# Patient Record
Sex: Male | Born: 1965 | Race: White | Hispanic: No | Marital: Married | Smoking: Never smoker
Health system: Southern US, Community
[De-identification: ages and names within clinical notes are randomized; demographics above are authoritative.]

## PROBLEM LIST (undated history)

## (undated) DIAGNOSIS — E119 Type 2 diabetes mellitus without complications: Secondary | ICD-10-CM

## (undated) DIAGNOSIS — M1990 Unspecified osteoarthritis, unspecified site: Secondary | ICD-10-CM

## (undated) DIAGNOSIS — F32A Depression, unspecified: Secondary | ICD-10-CM

## (undated) DIAGNOSIS — F419 Anxiety disorder, unspecified: Secondary | ICD-10-CM

## (undated) DIAGNOSIS — G4730 Sleep apnea, unspecified: Secondary | ICD-10-CM

## (undated) DIAGNOSIS — J189 Pneumonia, unspecified organism: Secondary | ICD-10-CM

## (undated) DIAGNOSIS — F4310 Post-traumatic stress disorder, unspecified: Secondary | ICD-10-CM

## (undated) DIAGNOSIS — C801 Malignant (primary) neoplasm, unspecified: Secondary | ICD-10-CM

## (undated) DIAGNOSIS — H9319 Tinnitus, unspecified ear: Secondary | ICD-10-CM

## (undated) DIAGNOSIS — Z974 Presence of external hearing-aid: Secondary | ICD-10-CM

## (undated) DIAGNOSIS — J45909 Unspecified asthma, uncomplicated: Secondary | ICD-10-CM

## (undated) DIAGNOSIS — T884XXA Failed or difficult intubation, initial encounter: Secondary | ICD-10-CM

## (undated) DIAGNOSIS — Z87442 Personal history of urinary calculi: Secondary | ICD-10-CM

## (undated) DIAGNOSIS — I10 Essential (primary) hypertension: Secondary | ICD-10-CM

## (undated) HISTORY — PX: TOTAL HIP ARTHROPLASTY: SHX124

## (undated) HISTORY — PX: TENDON REPAIR: SHX5111

## (undated) HISTORY — PX: KNEE ARTHROSCOPY: SUR90

## (undated) HISTORY — PX: JOINT REPLACEMENT: SHX530

## (undated) HISTORY — PX: BACK SURGERY: SHX140

## (undated) HISTORY — PX: ROTATOR CUFF REPAIR: SHX139

## (undated) HISTORY — PX: HERNIA REPAIR: SHX51

## (undated) HISTORY — PX: CARPAL TUNNEL RELEASE: SHX101

---
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
## 2013-08-21 ENCOUNTER — Ambulatory Visit: Payer: Self-pay | Admitting: Family Medicine

## 2013-12-21 ENCOUNTER — Ambulatory Visit: Payer: Self-pay | Admitting: Physician Assistant

## 2017-11-09 ENCOUNTER — Ambulatory Visit: Payer: PRIVATE HEALTH INSURANCE | Admitting: Allergy and Immunology

## 2021-02-10 HISTORY — PX: REPAIR / REINSERT BICEPS TENDON AT ELBOW: SUR1148

## 2021-07-13 ENCOUNTER — Other Ambulatory Visit: Payer: Self-pay

## 2021-07-13 ENCOUNTER — Encounter: Payer: Self-pay | Admitting: Emergency Medicine

## 2021-07-13 ENCOUNTER — Emergency Department: Payer: BC Managed Care – PPO

## 2021-07-13 ENCOUNTER — Emergency Department
Admission: EM | Admit: 2021-07-13 | Discharge: 2021-07-13 | Disposition: A | Discharge status: Home / Self Care | Payer: BC Managed Care – PPO | Attending: Emergency Medicine | Admitting: Emergency Medicine

## 2021-07-13 DIAGNOSIS — E860 Dehydration: Secondary | ICD-10-CM | POA: Insufficient documentation

## 2021-07-13 DIAGNOSIS — M25562 Pain in left knee: Secondary | ICD-10-CM | POA: Diagnosis not present

## 2021-07-13 DIAGNOSIS — R55 Syncope and collapse: Secondary | ICD-10-CM | POA: Diagnosis present

## 2021-07-13 DIAGNOSIS — I951 Orthostatic hypotension: Secondary | ICD-10-CM | POA: Insufficient documentation

## 2021-07-13 DIAGNOSIS — M79672 Pain in left foot: Secondary | ICD-10-CM | POA: Insufficient documentation

## 2021-07-13 DIAGNOSIS — R197 Diarrhea, unspecified: Secondary | ICD-10-CM | POA: Diagnosis not present

## 2021-07-13 DIAGNOSIS — I10 Essential (primary) hypertension: Secondary | ICD-10-CM | POA: Insufficient documentation

## 2021-07-13 DIAGNOSIS — R10814 Left lower quadrant abdominal tenderness: Secondary | ICD-10-CM | POA: Diagnosis not present

## 2021-07-13 HISTORY — DX: Essential (primary) hypertension: I10

## 2021-07-13 LAB — CBC WITH DIFFERENTIAL/PLATELET
Abs Immature Granulocytes: 0.05 10*3/uL (ref 0.00–0.07)
Basophils Absolute: 0 10*3/uL (ref 0.0–0.1)
Basophils Relative: 0 %
Eosinophils Absolute: 0.1 10*3/uL (ref 0.0–0.5)
Eosinophils Relative: 1 %
HCT: 39.6 % (ref 39.0–52.0)
Hemoglobin: 13.1 g/dL (ref 13.0–17.0)
Immature Granulocytes: 1 %
Lymphocytes Relative: 46 %
Lymphs Abs: 2.8 10*3/uL (ref 0.7–4.0)
MCH: 29.8 pg (ref 26.0–34.0)
MCHC: 33.1 g/dL (ref 30.0–36.0)
MCV: 90.2 fL (ref 80.0–100.0)
Monocytes Absolute: 0.5 10*3/uL (ref 0.1–1.0)
Monocytes Relative: 9 %
Neutro Abs: 2.6 10*3/uL (ref 1.7–7.7)
Neutrophils Relative %: 43 %
Platelets: 175 10*3/uL (ref 150–400)
RBC: 4.39 MIL/uL (ref 4.22–5.81)
RDW: 11.6 % (ref 11.5–15.5)
WBC: 6.1 10*3/uL (ref 4.0–10.5)
nRBC: 0 % (ref 0.0–0.2)

## 2021-07-13 LAB — COMPREHENSIVE METABOLIC PANEL
ALT: 32 U/L (ref 0–44)
AST: 19 U/L (ref 15–41)
Albumin: 3.7 g/dL (ref 3.5–5.0)
Alkaline Phosphatase: 79 U/L (ref 38–126)
Anion gap: 7 (ref 5–15)
BUN: 12 mg/dL (ref 6–20)
CO2: 28 mmol/L (ref 22–32)
Calcium: 8.7 mg/dL — ABNORMAL LOW (ref 8.9–10.3)
Chloride: 104 mmol/L (ref 98–111)
Creatinine, Ser: 0.94 mg/dL (ref 0.61–1.24)
GFR, Estimated: >60 mL/min (ref >60)
Glucose, Bld: 145 mg/dL — ABNORMAL HIGH (ref 70–99)
Potassium: 3.6 mmol/L (ref 3.5–5.1)
Sodium: 139 mmol/L (ref 135–145)
Total Bilirubin: 0.7 mg/dL (ref 0.3–1.2)
Total Protein: 6.9 g/dL (ref 6.5–8.1)

## 2021-07-13 LAB — TROPONIN I (HIGH SENSITIVITY): Troponin I (High Sensitivity): <2 ng/L (ref <18)

## 2021-07-13 LAB — TSH: TSH: 2.645 u[IU]/mL (ref 0.350–4.500)

## 2021-07-13 MED ORDER — OXYCODONE-ACETAMINOPHEN 5-325 MG PO TABS
1.0000 | ORAL_TABLET | Freq: Once | ORAL | Status: AC
  Administered 2021-07-13: 1 via ORAL
  Filled 2021-07-13: qty 1

## 2021-07-13 MED ORDER — LACTATED RINGERS IV BOLUS
1000.0000 mL | Freq: Once | INTRAVENOUS | Status: AC
  Administered 2021-07-13: 1000 mL via INTRAVENOUS

## 2021-07-13 MED ORDER — ONDANSETRON 4 MG PO TBDP
4.0000 mg | ORAL_TABLET | Freq: Once | ORAL | Status: AC
  Administered 2021-07-13: 4 mg via ORAL
  Filled 2021-07-13: qty 1

## 2021-07-13 NOTE — Discharge Instructions (Signed)
Stop any blood pressure medications for now, until you see your doctor on Monday.  Drink at least 6-8 glasses of water daily.  Discuss your Er visit, low blood pressure, and borderline low HR with your PCP on Monday.  Be very cautious going from sitting to standing.

## 2021-07-13 NOTE — ED Notes (Signed)
X-ray at bedside

## 2021-07-13 NOTE — ED Notes (Signed)
Pt ambulatory around the room by Byrd Hesselbach, NT, no dizziness noted. Pt states he feels better but his foot still hurts. Pt given saltines and water for a PO challenge denies any nausea at this time

## 2021-07-13 NOTE — ED Triage Notes (Signed)
Patient to ED via [facility] from home for syncopal episode after using the restroom this AM. Patient states he hit head on shower. C/o left knee and foot pain. Aox4 at this time.

## 2021-07-13 NOTE — ED Provider Notes (Signed)
[facility] Provider Note    Event Date/Time   First MD Initiated Contact with Patient 07/13/21 [phone]     (approximate)   History   Loss of Consciousness   HPI  Cameron [name] is a 56 y.o. male   with past medical history of hypertension, here with syncopal episode.  The patient states that this morning, he was getting up to start his day.  He was using the bathroom, standing, peeing, when he reportedly lost consciousness.  He states he did not necessarily feel a warning.  He states he does have a history of low blood pressure as well as low heart rate, and has had episodes where he gets dizzy standing.  He also recently had a bout of diverticulitis on 22 May, feels better from this but did have significant diarrhea.  He states he been trying to eat and drink normally since then.  Currently, he states he feels overall well.  Denies any significant pain other than his neck, which he has chronic pain but is mildly worse since the fall.  He has chronic left-sided radiculopathy that is not necessarily worse.  He also has some mild pain in his left knee and left foot.  Reports that he otherwise has had no recent medication changes.  Denies recent fevers or chills.  No chest pain.  He did not feel any palpitations.  With EMS, the patient was set up twice, and had recurrent lightheadedness, dizziness, followed by a recurrent witnessed syncopal episode.  There is no seizure-like episode.  No loss of bowel or bladder function.  No tongue biting.   Physical Exam   Triage Vital Signs: ED Triage Vitals  Enc Vitals Group     BP 07/13/21 0738 106/73     Pulse Rate 07/13/21 0738 62     Resp 07/13/21 0738 18     Temp --      Temp src --      SpO2 07/13/21 0738 95 %     Weight 07/13/21 0738 195 lb 3.2 oz (88.5 kg)     Height --      Head Circumference --      Peak Flow --      Pain Score 07/13/21 0743 8     Pain Loc --      Pain Edu? --      Excl. in GC? --      Most recent vital signs: Vitals:   07/13/21 1130 07/13/21 1200  BP: 105/73 97/77  Pulse: (!) 51 (!) 56  Resp: 10 11  Temp:    SpO2: 97% 96%     General: Awake, no distress.  CV:  Good peripheral perfusion.  No murmurs or rubs. Resp:  Normal effort.  Lungs clear bilaterally. Abd:  No distention.  Minimal left lower quadrant tenderness.  No rebound or guarding. Other:  Moderately dry mucous membranes.  Appears slightly pale.   ED Results / Procedures / Treatments   Labs (all labs ordered are listed, but only abnormal results are displayed) Labs Reviewed  COMPREHENSIVE METABOLIC PANEL - Abnormal; Notable for the following components:      Result Value   Glucose, Bld 145 (*)    Calcium 8.7 (*)    All other components within normal limits  CBC WITH DIFFERENTIAL/PLATELET  TSH  TROPONIN I (HIGH SENSITIVITY)  TROPONIN I (HIGH SENSITIVITY)     EKG Normal sinus rhythm, ventricular rate 60.  PR 146, QRS 103, QTc 472.  No acute ST elevations or depressions.  No EKG evidence of acute ischemia or infarct.   RADIOLOGY DG knee left: Negative DG foot left: Negative CT head/C-spine: Negative, chronic arthritic changes and prior surgical fixation, no acute abnormality DG elbow right: Negative  I also independently reviewed and agree with radiologist interpretations.   PROCEDURES:  Critical Care performed: No  .1-3 Lead EKG Interpretation Performed by: Shaune Pollack, MD Authorized by: Shaune Pollack, MD     Interpretation: abnormal     ECG rate:  50-60   ECG rate assessment: bradycardic     Rhythm: sinus rhythm     Ectopy: none     Conduction: normal   Comments:     Indication: Syncope/weakness    MEDICATIONS ORDERED IN ED: Medications  lactated ringers bolus 1,000 mL (0 mLs Intravenous Stopped 07/13/21 1100)  oxyCODONE-acetaminophen (PERCOCET/ROXICET) 5-325 MG per tablet 1 tablet (1 tablet Oral Given 07/13/21 0836)  ondansetron (ZOFRAN-ODT) disintegrating  tablet 4 mg (4 mg Oral Given 07/13/21 0835)  lactated ringers bolus 1,000 mL (0 mLs Intravenous Stopped 07/13/21 1100)     IMPRESSION / MDM / ASSESSMENT AND PLAN / ED COURSE  I reviewed the triage vital signs and the nursing notes.                               The patient is on the cardiac monitor to evaluate for evidence of arrhythmia and/or significant heart rate changes.   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  Patient appears dehydrated clinically, is hypotensive with position changes on arrival.  Of note, he reportedly has been running fairly low blood pressures since a recent bout of diverticulitis, and is actually being seen by his PCP for this.  He was also told that his heart rate runs low naturally.  He denies any chest pain.  He has been eating and drinking normally for the last day or 2, and states that his diverticulosis related pain is significantly improved.  Lab work reviewed, with CBC shows no significant leukocytosis or anemia.  He has no significant abdominal tenderness.  Do not suspect ongoing diverticulitis or complication.  CMP shows normal renal function and is otherwise reassuring.  Troponin negative, EKG nonischemic.  TSH normal.  Plain films of any areas of tenderness obtained, showed no acute fracture or abnormality.  CT head and C-spine obtained, show his chronic arthritis of his neck but no acute maladies.  No evidence to suggest acute radiculopathy.  Suspect relative orthostasis as well as effect of antihypertensive.  We will have him hold this, hydrate at home, and follow-up with  his PCP, which he already has arranged for follow-up on Monday.   MEDICATIONS GIVEN IN ED: Medications  lactated ringers bolus 1,000 mL (0 mLs Intravenous Stopped 07/13/21 1100)  oxyCODONE-acetaminophen (PERCOCET/ROXICET) 5-325 MG per tablet 1 tablet (1 tablet Oral Given 07/13/21 0836)  ondansetron (ZOFRAN-ODT) disintegrating tablet 4 mg (4 mg Oral Given 07/13/21 0835)  lactated ringers bolus 1,000 mL (0 mLs Intravenous Stopped 07/13/21 1100)     Consults:     EMR reviewed  Reviewed office visits with pulmonology at [facility], most recently 06/2021  FINAL CLINICAL IMPRESSION(S) / ED DIAGNOSES   Final diagnoses:  Syncope and collapse  Orthostasis     Rx / DC Orders   ED Discharge Orders     None        Note:  This document was prepared using Dragon voice recognition software and may include unintentional dictation errors.   Shaune Pollack, MD 07/13/21 1226

## 2021-12-10 ENCOUNTER — Other Ambulatory Visit: Payer: Self-pay | Admitting: Neurosurgery

## 2021-12-13 ENCOUNTER — Other Ambulatory Visit: Payer: Self-pay | Admitting: Neurosurgery

## 2022-01-17 NOTE — Pre-Procedure Instructions (Signed)
Surgical Instructions    Your procedure is scheduled on Monday, January 27, 2022.  Report to [facility] Main Entrance "A" at 5:30 A.M., then check in with the Admitting office.  Call this number if you have problems the morning of surgery:  [phone]   If you have any questions prior to your surgery date call [phone]: Open Monday-Friday 8am-4pm If you experience any cold or flu symptoms such as cough, fever, chills, shortness of breath, etc. between now and your scheduled surgery, please notify us at the above number     Remember:  Do not eat or drink after midnight the night before your surgery      Take these medicines the morning of surgery with A SIP OF WATER:  gabapentin (NEURONTIN)  venlafaxine XR (EFFEXOR-XR)  fluticasone (FLONASE)  Fluticasone-Umeclidin-Vilant (TRELEGY ELLIPTA)   IF NEEDED: albuterol (VENTOLIN HFA) -please bring this inhaler with you day of surgery  budesonide-formoterol (SYMBICORT) inhaler acetaminophen (TYLENOL)  rizatriptan (MAXALT)  Olopatadine HCl (PATADAY)   As of today, STOP taking any Aspirin (unless otherwise instructed by your surgeon) Aleve, Naproxen, Ibuprofen, Motrin, Advil, Goody's, BC's, all herbal medications, fish oil, and all vitamins.  WHAT DO I DO ABOUT MY DIABETES MEDICATION?  Please hold Semaglutide (Ozempic) 7 days prior to your surgery-please do not take this on Wednesday 01/22/22  HOW TO MANAGE YOUR DIABETES BEFORE AND AFTER SURGERY  Why is it important to control my blood sugar before and after surgery? Improving blood sugar levels before and after surgery helps healing and can limit problems. A way of improving blood sugar control is eating a healthy diet by:  Eating less sugar and carbohydrates  Increasing activity/exercise  Talking with your doctor about reaching your blood sugar goals High blood sugars (greater than 180 mg/dL) can raise your risk of infections and slow your recovery, so you will need to  focus on controlling your diabetes during the weeks before surgery. Make sure that the doctor who takes care of your diabetes knows about your planned surgery including the date and location.  How do I manage my blood sugar before surgery? Check your blood sugar at least 4 times a day, starting 2 days before surgery, to make sure that the level is not too high or low.  Check your blood sugar the morning of your surgery when you wake up and every 2 hours until you get to the Short Stay unit.  If your blood sugar is less than 70 mg/dL, you will need to treat for low blood sugar: Do not take insulin. Treat a low blood sugar (less than 70 mg/dL) with  cup of clear juice (cranberry or apple), 4 glucose tablets, OR glucose gel. Recheck blood sugar in 15 minutes after treatment (to make sure it is greater than 70 mg/dL). If your blood sugar is not greater than 70 mg/dL on recheck, call [phone] for further instructions. Report your blood sugar to the short stay nurse when you get to Short Stay.  If you are admitted to the hospital after surgery: Your blood sugar will be checked by the staff and you will probably be given insulin after surgery (instead of oral diabetes medicines) to make sure you have good blood sugar levels. The goal for blood sugar control after surgery is 80-180 mg/dL.           Do not wear jewelry. Do not wear lotions, powders, cologne or deodorant. Do not shave 48 hours prior to surgery.  Men may shave face  and neck. Do not bring valuables to the hospital. Do not wear nail polish, gel polish, artificial nails, or any other type of covering on natural nails (fingers and toes)  [facility] is not responsible for any belongings or valuables.    Do NOT Smoke (Tobacco/Vaping)  24 hours prior to your procedure  If you use a CPAP at night, you may bring your mask for your overnight stay.   Contacts, glasses, hearing aids, dentures or partials may not be worn into surgery,  please bring cases for these belongings   For patients admitted to the hospital, discharge time will be determined by your treatment team.   Patients discharged the day of surgery will not be allowed to drive home, and someone needs to stay with them for 24 hours.   SURGICAL WAITING ROOM VISITATION Patients having surgery or a procedure may have no more than 2 support people in the waiting area - these visitors may rotate.   Children under the age of 45 must have an adult with them who is not the patient. If the patient needs to stay at the hospital during part of their recovery, the visitor guidelines for inpatient rooms apply. Pre-op nurse will coordinate an appropriate time for 1 support person to accompany patient in pre-op.  This support person may not rotate.   Please refer to [email or web address] for the visitor guidelines for Inpatients (after your surgery is over and you are in a regular room).    Special instructions:    Oral Hygiene is also important to reduce your risk of infection.  Remember - BRUSH YOUR TEETH THE MORNING OF SURGERY WITH YOUR REGULAR TOOTHPASTE   [place]- Preparing For Surgery  Before surgery, you can play an important role. Because skin is not sterile, your skin needs to be as free of germs as possible. You can reduce the number of germs on your skin by washing with CHG (chlorahexidine gluconate) Soap before surgery.  CHG is an antiseptic cleaner which kills germs and bonds with the skin to continue killing germs even after washing.     Please do not use if you have an allergy to CHG or antibacterial soaps. If your skin becomes reddened/irritated stop using the CHG.  Do not shave (including legs and underarms) for at least 48 hours prior to first CHG shower. It is OK to shave your face.  Please follow these instructions carefully.     Shower the NIGHT BEFORE SURGERY and the MORNING OF SURGERY with CHG  Soap.   If you chose to wash your hair, wash your hair first as usual with your normal shampoo. After you shampoo, rinse your hair and body thoroughly to remove the shampoo.  Then [employer] and genitals (private parts) with your normal soap and rinse thoroughly to remove soap.  After that Use CHG Soap as you would any other liquid soap. You can apply CHG directly to the skin and wash gently with a scrungie or a clean washcloth.   Apply the CHG Soap to your body ONLY FROM THE NECK DOWN.  Do not use on open wounds or open sores. Avoid contact with your eyes, ears, mouth and genitals (private parts). Wash Face and genitals (private parts)  with your normal soap.   Wash thoroughly, paying special attention to the area where your surgery will be performed.  Thoroughly rinse your body with warm water from the neck down.  DO NOT shower/wash with your normal soap after using and rinsing  off the CHG Soap.  Pat yourself dry with a CLEAN TOWEL.  Wear CLEAN PAJAMAS to bed the night before surgery  Place CLEAN SHEETS on your bed the night before your surgery  DO NOT SLEEP WITH PETS.   Day of Surgery:  Take a shower with CHG soap. Wear Clean/Comfortable clothing the morning of surgery Do not apply any deodorants/lotions.   Remember to brush your teeth WITH YOUR REGULAR TOOTHPASTE.    If you received a COVID test during your pre-op visit, it is requested that you wear a mask when out in public, stay away from anyone that may not be feeling well, and notify your surgeon if you develop symptoms. If you have been in contact with anyone that has tested positive in the last 10 days, please notify your surgeon.    Please read over the following fact sheets that you were given.

## 2022-01-20 ENCOUNTER — Encounter (HOSPITAL_COMMUNITY): Payer: Self-pay

## 2022-01-20 ENCOUNTER — Encounter (HOSPITAL_COMMUNITY)
Admission: RE | Admit: 2022-01-20 | Discharge: 2022-01-20 | Disposition: A | Discharge status: Home / Self Care | Payer: BC Managed Care – PPO | Source: Ambulatory Visit | Attending: Neurosurgery | Admitting: Neurosurgery

## 2022-01-20 ENCOUNTER — Other Ambulatory Visit: Payer: Self-pay

## 2022-01-20 VITALS — BP 121/80 | HR 62 | Temp 98.0°F | Resp 18 | Ht 68.0 in | Wt 193.8 lb

## 2022-01-20 DIAGNOSIS — Z01818 Encounter for other preprocedural examination: Secondary | ICD-10-CM

## 2022-01-20 DIAGNOSIS — E119 Type 2 diabetes mellitus without complications: Secondary | ICD-10-CM | POA: Diagnosis not present

## 2022-01-20 DIAGNOSIS — Z01812 Encounter for preprocedural laboratory examination: Secondary | ICD-10-CM | POA: Diagnosis not present

## 2022-01-20 HISTORY — DX: Type 2 diabetes mellitus without complications: E11.9

## 2022-01-20 HISTORY — DX: Pneumonia, unspecified organism: J18.9

## 2022-01-20 HISTORY — DX: Unspecified osteoarthritis, unspecified site: M19.90

## 2022-01-20 HISTORY — DX: Personal history of urinary calculi: Z87.442

## 2022-01-20 HISTORY — DX: Unspecified asthma, uncomplicated: J45.909

## 2022-01-20 LAB — SURGICAL PCR SCREEN
MRSA, PCR: NEGATIVE
Staphylococcus aureus: NEGATIVE

## 2022-01-20 LAB — BASIC METABOLIC PANEL
Anion gap: 8 (ref 5–15)
BUN: 15 mg/dL (ref 6–20)
CO2: 28 mmol/L (ref 22–32)
Calcium: 9.2 mg/dL (ref 8.9–10.3)
Chloride: 103 mmol/L (ref 98–111)
Creatinine, Ser: 0.86 mg/dL (ref 0.61–1.24)
GFR, Estimated: >60 mL/min (ref >60)
Glucose, Bld: 105 mg/dL — ABNORMAL HIGH (ref 70–99)
Potassium: 4.2 mmol/L (ref 3.5–5.1)
Sodium: 139 mmol/L (ref 135–145)

## 2022-01-20 LAB — TYPE AND SCREEN
ABO/RH(D): A POS
Antibody Screen: NEGATIVE

## 2022-01-20 LAB — CBC
HCT: 44 % (ref 39.0–52.0)
Hemoglobin: 14.3 g/dL (ref 13.0–17.0)
MCH: 30.6 pg (ref 26.0–34.0)
MCHC: 32.5 g/dL (ref 30.0–36.0)
MCV: 94 fL (ref 80.0–100.0)
Platelets: 187 10*3/uL (ref 150–400)
RBC: 4.68 MIL/uL (ref 4.22–5.81)
RDW: 12.4 % (ref 11.5–15.5)
WBC: 6.5 10*3/uL (ref 4.0–10.5)
nRBC: 0 % (ref 0.0–0.2)

## 2022-01-20 LAB — GLUCOSE, CAPILLARY: Glucose-Capillary: 97 mg/dL (ref 70–99)

## 2022-01-20 NOTE — Progress Notes (Signed)
[facility]- [facility] not follow as he was cleared. Only saw for blood pressure management  PPM/ICD - denies   Chest x-ray - n/a EKG - 07/13/21 Stress Test - 2014-CE ECHO - 2023 normal [name] can see result in EPIC- no need to request records) Cardiac Cath - denies  Sleep Study - +OSA CPAP - wear nightly. Settings start at 12 and then it autotitrates through the night   Fasting Blood Sugar - 100 Checks Blood Sugar once a day  Last dose of GLP1 agonist-  01/15/22 GLP1 instructions: do no take dose on 01/22/22.  Follow your surgeon's instructions on when to stop Aspirin.  If no instructions were given by your surgeon then you will need to call the office to get those instructions.     ERAS Protcol -no   COVID TEST- not needed   Anesthesia review: no  Patient denies shortness of breath, fever, cough and chest pain at PAT appointment   All instructions explained to the patient, with a verbal understanding of the material. Patient agrees to go over the instructions while at home for a better understanding. Patient also instructed to self quarantine after being tested for COVID-19. The opportunity to ask questions was provided.

## 2022-01-27 ENCOUNTER — Inpatient Hospital Stay (HOSPITAL_COMMUNITY): Payer: BC Managed Care – PPO | Admitting: Physician Assistant

## 2022-01-27 ENCOUNTER — Inpatient Hospital Stay (HOSPITAL_COMMUNITY)
Admission: RE | Admit: 2022-01-27 | Discharge: 2022-01-28 | DRG: 473 | Disposition: A | Discharge status: Home / Self Care | Payer: BC Managed Care – PPO | Attending: Neurosurgery | Admitting: Neurosurgery

## 2022-01-27 ENCOUNTER — Other Ambulatory Visit: Payer: Self-pay

## 2022-01-27 ENCOUNTER — Encounter (HOSPITAL_COMMUNITY): Payer: Self-pay | Admitting: Neurosurgery

## 2022-01-27 ENCOUNTER — Inpatient Hospital Stay (HOSPITAL_COMMUNITY): Payer: BC Managed Care – PPO

## 2022-01-27 ENCOUNTER — Encounter (HOSPITAL_COMMUNITY)
Admission: RE | Disposition: A | Discharge status: Home / Self Care | Payer: Self-pay | Source: Home / Self Care | Attending: Neurosurgery

## 2022-01-27 ENCOUNTER — Inpatient Hospital Stay (HOSPITAL_COMMUNITY): Payer: BC Managed Care – PPO | Admitting: *Deleted

## 2022-01-27 DIAGNOSIS — Z7985 Long-term (current) use of injectable non-insulin antidiabetic drugs: Secondary | ICD-10-CM | POA: Diagnosis not present

## 2022-01-27 DIAGNOSIS — Z96652 Presence of left artificial knee joint: Secondary | ICD-10-CM | POA: Diagnosis present

## 2022-01-27 DIAGNOSIS — M47812 Spondylosis without myelopathy or radiculopathy, cervical region: Secondary | ICD-10-CM | POA: Diagnosis present

## 2022-01-27 DIAGNOSIS — Z91040 Latex allergy status: Secondary | ICD-10-CM

## 2022-01-27 DIAGNOSIS — M778 Other enthesopathies, not elsewhere classified: Secondary | ICD-10-CM | POA: Diagnosis present

## 2022-01-27 DIAGNOSIS — Z91018 Allergy to other foods: Secondary | ICD-10-CM

## 2022-01-27 DIAGNOSIS — E119 Type 2 diabetes mellitus without complications: Secondary | ICD-10-CM | POA: Diagnosis present

## 2022-01-27 DIAGNOSIS — G8929 Other chronic pain: Secondary | ICD-10-CM | POA: Diagnosis present

## 2022-01-27 DIAGNOSIS — Z881 Allergy status to other antibiotic agents status: Secondary | ICD-10-CM | POA: Diagnosis not present

## 2022-01-27 DIAGNOSIS — Z96642 Presence of left artificial hip joint: Secondary | ICD-10-CM | POA: Diagnosis present

## 2022-01-27 DIAGNOSIS — Z79891 Long term (current) use of opiate analgesic: Secondary | ICD-10-CM | POA: Diagnosis not present

## 2022-01-27 DIAGNOSIS — Z880 Allergy status to penicillin: Secondary | ICD-10-CM

## 2022-01-27 DIAGNOSIS — I10 Essential (primary) hypertension: Secondary | ICD-10-CM | POA: Diagnosis present

## 2022-01-27 DIAGNOSIS — Z888 Allergy status to other drugs, medicaments and biological substances status: Secondary | ICD-10-CM | POA: Diagnosis not present

## 2022-01-27 DIAGNOSIS — Z79899 Other long term (current) drug therapy: Secondary | ICD-10-CM

## 2022-01-27 DIAGNOSIS — Z7951 Long term (current) use of inhaled steroids: Secondary | ICD-10-CM | POA: Diagnosis not present

## 2022-01-27 DIAGNOSIS — Y838 Other surgical procedures as the cause of abnormal reaction of the patient, or of later complication, without mention of misadventure at the time of the procedure: Secondary | ICD-10-CM | POA: Diagnosis present

## 2022-01-27 DIAGNOSIS — Z886 Allergy status to analgesic agent status: Secondary | ICD-10-CM

## 2022-01-27 DIAGNOSIS — S129XXA Fracture of neck, unspecified, initial encounter: Principal | ICD-10-CM | POA: Diagnosis present

## 2022-01-27 DIAGNOSIS — M1990 Unspecified osteoarthritis, unspecified site: Secondary | ICD-10-CM | POA: Diagnosis present

## 2022-01-27 DIAGNOSIS — M960 Pseudarthrosis after fusion or arthrodesis: Principal | ICD-10-CM | POA: Diagnosis present

## 2022-01-27 HISTORY — PX: ANTERIOR CERVICAL DECOMP/DISCECTOMY FUSION: SHX1161

## 2022-01-27 LAB — GLUCOSE, CAPILLARY
Glucose-Capillary: 115 mg/dL — ABNORMAL HIGH (ref 70–99)
Glucose-Capillary: 146 mg/dL — ABNORMAL HIGH (ref 70–99)
Glucose-Capillary: 163 mg/dL — ABNORMAL HIGH (ref 70–99)
Glucose-Capillary: 196 mg/dL — ABNORMAL HIGH (ref 70–99)

## 2022-01-27 LAB — ABO/RH: ABO/RH(D): A POS

## 2022-01-27 SURGERY — ANTERIOR CERVICAL DECOMPRESSION/DISCECTOMY FUSION 1 LEVEL/HARDWARE REMOVAL
Anesthesia: General | Site: Spine Cervical

## 2022-01-27 MED ORDER — GABAPENTIN 300 MG PO CAPS
300.0000 mg | ORAL_CAPSULE | Freq: Three times a day (TID) | ORAL | Status: DC
  Administered 2022-01-27 – 2022-01-28 (×3): 300 mg via ORAL
  Filled 2022-01-27 (×3): qty 1

## 2022-01-27 MED ORDER — ONDANSETRON HCL 4 MG/2ML IJ SOLN
INTRAMUSCULAR | Status: AC
  Filled 2022-01-27: qty 2

## 2022-01-27 MED ORDER — OXYCODONE HCL 5 MG/5ML PO SOLN: 5.0000 mg | Freq: Once | ORAL | Status: DC | PRN

## 2022-01-27 MED ORDER — LORATADINE 10 MG PO TABS
10.0000 mg | ORAL_TABLET | Freq: Every day | ORAL | Status: DC
  Administered 2022-01-27: 10 mg via ORAL
  Filled 2022-01-27: qty 1

## 2022-01-27 MED ORDER — ONDANSETRON HCL 4 MG/2ML IJ SOLN: 4.0000 mg | Freq: Four times a day (QID) | INTRAMUSCULAR | Status: DC | PRN

## 2022-01-27 MED ORDER — LIDOCAINE 2% (20 MG/ML) 5 ML SYRINGE
INTRAMUSCULAR | Status: DC | PRN
  Administered 2022-01-27: 50 mg via INTRAVENOUS

## 2022-01-27 MED ORDER — DEXAMETHASONE 4 MG PO TABS
4.0000 mg | ORAL_TABLET | Freq: Four times a day (QID) | ORAL | Status: AC
  Administered 2022-01-27 (×2): 4 mg via ORAL
  Filled 2022-01-27 (×2): qty 1

## 2022-01-27 MED ORDER — OXYCODONE HCL 5 MG PO TABS
10.0000 mg | ORAL_TABLET | ORAL | Status: DC | PRN
  Administered 2022-01-27 – 2022-01-28 (×5): 10 mg via ORAL
  Filled 2022-01-27 (×5): qty 2

## 2022-01-27 MED ORDER — PROPOFOL 10 MG/ML IV BOLUS
INTRAVENOUS | Status: DC | PRN
  Administered 2022-01-27: 180 mg via INTRAVENOUS
  Administered 2022-01-27: 50 mg via INTRAVENOUS
  Administered 2022-01-27: 20 mg via INTRAVENOUS

## 2022-01-27 MED ORDER — MIDAZOLAM HCL 2 MG/2ML IJ SOLN
INTRAMUSCULAR | Status: DC | PRN
  Administered 2022-01-27: 2 mg via INTRAVENOUS

## 2022-01-27 MED ORDER — BUPIVACAINE-EPINEPHRINE 0.5% -1:200000 IJ SOLN
INTRAMUSCULAR | Status: DC | PRN
  Administered 2022-01-27: 10 mL

## 2022-01-27 MED ORDER — FLUTICASONE FUROATE-VILANTEROL 200-25 MCG/ACT IN AEPB
1.0000 | INHALATION_SPRAY | Freq: Every day | RESPIRATORY_TRACT | Status: DC
  Filled 2022-01-27: qty 28

## 2022-01-27 MED ORDER — FENTANYL CITRATE (PF) 250 MCG/5ML IJ SOLN
INTRAMUSCULAR | Status: AC
  Filled 2022-01-27: qty 5

## 2022-01-27 MED ORDER — THROMBIN 5000 UNITS EX SOLR: OROMUCOSAL | Status: DC | PRN

## 2022-01-27 MED ORDER — CYCLOBENZAPRINE HCL 10 MG PO TABS: 10.0000 mg | ORAL_TABLET | Freq: Three times a day (TID) | ORAL | Status: DC | PRN

## 2022-01-27 MED ORDER — LACTATED RINGERS IV SOLN: INTRAVENOUS | Status: DC

## 2022-01-27 MED ORDER — LIDOCAINE 2% (20 MG/ML) 5 ML SYRINGE
INTRAMUSCULAR | Status: AC
  Filled 2022-01-27: qty 5

## 2022-01-27 MED ORDER — ACETAMINOPHEN 325 MG PO TABS: 650.0000 mg | ORAL_TABLET | ORAL | Status: DC | PRN

## 2022-01-27 MED ORDER — ATORVASTATIN CALCIUM 10 MG PO TABS
20.0000 mg | ORAL_TABLET | Freq: Every day | ORAL | Status: DC
  Administered 2022-01-27: 20 mg via ORAL
  Filled 2022-01-27: qty 2

## 2022-01-27 MED ORDER — BISACODYL 10 MG RE SUPP: 10.0000 mg | Freq: Every day | RECTAL | Status: DC | PRN

## 2022-01-27 MED ORDER — OXYCODONE HCL 5 MG PO TABS: 5.0000 mg | ORAL_TABLET | Freq: Once | ORAL | Status: DC | PRN

## 2022-01-27 MED ORDER — PHENYLEPHRINE HCL-NACL 20-0.9 MG/250ML-% IV SOLN
INTRAVENOUS | Status: DC | PRN
  Administered 2022-01-27: 20 ug/min via INTRAVENOUS

## 2022-01-27 MED ORDER — HYDROXYZINE HCL 25 MG PO TABS
25.0000 mg | ORAL_TABLET | ORAL | Status: DC | PRN
  Administered 2022-01-27 – 2022-01-28 (×3): 25 mg via ORAL
  Filled 2022-01-27 (×3): qty 1

## 2022-01-27 MED ORDER — DEXAMETHASONE SODIUM PHOSPHATE 10 MG/ML IJ SOLN
INTRAMUSCULAR | Status: DC | PRN
  Administered 2022-01-27: 4 mg via INTRAVENOUS

## 2022-01-27 MED ORDER — DEXAMETHASONE SODIUM PHOSPHATE 4 MG/ML IJ SOLN: 4.0000 mg | Freq: Four times a day (QID) | INTRAMUSCULAR | Status: AC

## 2022-01-27 MED ORDER — BUPIVACAINE-EPINEPHRINE (PF) 0.5% -1:200000 IJ SOLN
INTRAMUSCULAR | Status: AC
  Filled 2022-01-27: qty 30

## 2022-01-27 MED ORDER — LACTATED RINGERS IV SOLN: INTRAVENOUS | Status: DC | PRN

## 2022-01-27 MED ORDER — BACITRACIN ZINC 500 UNIT/GM EX OINT
TOPICAL_OINTMENT | CUTANEOUS | Status: AC
  Filled 2022-01-27: qty 28.35

## 2022-01-27 MED ORDER — VENLAFAXINE HCL ER 75 MG PO CP24
150.0000 mg | ORAL_CAPSULE | Freq: Every day | ORAL | Status: DC
  Filled 2022-01-27: qty 2

## 2022-01-27 MED ORDER — HYDROXYZINE HCL 25 MG PO TABS
25.0000 mg | ORAL_TABLET | Freq: Four times a day (QID) | ORAL | Status: DC | PRN
  Administered 2022-01-27 (×2): 25 mg via ORAL
  Filled 2022-01-27 (×2): qty 1

## 2022-01-27 MED ORDER — OXYCODONE HCL 5 MG PO TABS: 5.0000 mg | ORAL_TABLET | ORAL | Status: DC | PRN

## 2022-01-27 MED ORDER — MONTELUKAST SODIUM 10 MG PO TABS
10.0000 mg | ORAL_TABLET | Freq: Every day | ORAL | Status: DC
  Administered 2022-01-27: 10 mg via ORAL
  Filled 2022-01-27: qty 1

## 2022-01-27 MED ORDER — INSULIN ASPART 100 UNIT/ML IJ SOLN: 0.0000 [IU] | INTRAMUSCULAR | Status: DC | PRN

## 2022-01-27 MED ORDER — DOCUSATE SODIUM 100 MG PO CAPS
100.0000 mg | ORAL_CAPSULE | Freq: Two times a day (BID) | ORAL | Status: DC
  Administered 2022-01-27 (×2): 100 mg via ORAL
  Filled 2022-01-27 (×2): qty 1

## 2022-01-27 MED ORDER — SEMAGLUTIDE (1 MG/DOSE) 4 MG/3ML ~~LOC~~ SOPN: 1.0000 mg | PEN_INJECTOR | SUBCUTANEOUS | Status: DC

## 2022-01-27 MED ORDER — ROCURONIUM BROMIDE 10 MG/ML (PF) SYRINGE
PREFILLED_SYRINGE | INTRAVENOUS | Status: AC
  Filled 2022-01-27: qty 10

## 2022-01-27 MED ORDER — ACETAMINOPHEN 650 MG RE SUPP: 650.0000 mg | RECTAL | Status: DC | PRN

## 2022-01-27 MED ORDER — MENTHOL 3 MG MT LOZG
1.0000 | LOZENGE | OROMUCOSAL | Status: DC | PRN
  Filled 2022-01-27: qty 9

## 2022-01-27 MED ORDER — CHLORHEXIDINE GLUCONATE CLOTH 2 % EX PADS: 6.0000 | MEDICATED_PAD | Freq: Once | CUTANEOUS | Status: DC

## 2022-01-27 MED ORDER — PHENOL 1.4 % MT LIQD: 1.0000 | OROMUCOSAL | Status: DC | PRN

## 2022-01-27 MED ORDER — UMECLIDINIUM BROMIDE 62.5 MCG/ACT IN AEPB
1.0000 | INHALATION_SPRAY | Freq: Every day | RESPIRATORY_TRACT | Status: DC
  Filled 2022-01-27: qty 7

## 2022-01-27 MED ORDER — ACETAMINOPHEN 500 MG PO TABS
1000.0000 mg | ORAL_TABLET | Freq: Four times a day (QID) | ORAL | Status: AC
  Administered 2022-01-27 – 2022-01-28 (×4): 1000 mg via ORAL
  Filled 2022-01-27 (×4): qty 2

## 2022-01-27 MED ORDER — ORAL CARE MOUTH RINSE: 15.0000 mL | Freq: Once | OROMUCOSAL | Status: AC

## 2022-01-27 MED ORDER — MOMETASONE FURO-FORMOTEROL FUM 200-5 MCG/ACT IN AERO
2.0000 | INHALATION_SPRAY | Freq: Two times a day (BID) | RESPIRATORY_TRACT | Status: DC
  Administered 2022-01-27: 2 via RESPIRATORY_TRACT
  Filled 2022-01-27: qty 8.8

## 2022-01-27 MED ORDER — VANCOMYCIN HCL IN DEXTROSE 1-5 GM/200ML-% IV SOLN: 1000.0000 mg | INTRAVENOUS | Status: DC

## 2022-01-27 MED ORDER — ONDANSETRON HCL 4 MG PO TABS: 4.0000 mg | ORAL_TABLET | Freq: Four times a day (QID) | ORAL | Status: DC | PRN

## 2022-01-27 MED ORDER — ALBUTEROL SULFATE (2.5 MG/3ML) 0.083% IN NEBU
2.5000 mg | INHALATION_SOLUTION | Freq: Four times a day (QID) | RESPIRATORY_TRACT | Status: DC | PRN
  Administered 2022-01-28: 2.5 mg via RESPIRATORY_TRACT
  Filled 2022-01-27: qty 3

## 2022-01-27 MED ORDER — ROCURONIUM BROMIDE 10 MG/ML (PF) SYRINGE
PREFILLED_SYRINGE | INTRAVENOUS | Status: DC | PRN
  Administered 2022-01-27 (×2): 20 mg via INTRAVENOUS
  Administered 2022-01-27: 60 mg via INTRAVENOUS

## 2022-01-27 MED ORDER — FENTANYL CITRATE (PF) 100 MCG/2ML IJ SOLN
INTRAMUSCULAR | Status: AC
  Filled 2022-01-27: qty 2

## 2022-01-27 MED ORDER — TRAZODONE HCL 50 MG PO TABS
125.0000 mg | ORAL_TABLET | Freq: Every day | ORAL | Status: DC
  Administered 2022-01-27: 125 mg via ORAL
  Filled 2022-01-27: qty 1

## 2022-01-27 MED ORDER — FENTANYL CITRATE (PF) 100 MCG/2ML IJ SOLN
25.0000 ug | INTRAMUSCULAR | Status: DC | PRN
  Administered 2022-01-27 (×2): 50 ug via INTRAVENOUS

## 2022-01-27 MED ORDER — SUGAMMADEX SODIUM 200 MG/2ML IV SOLN
INTRAVENOUS | Status: DC | PRN
  Administered 2022-01-27: 200 mg via INTRAVENOUS

## 2022-01-27 MED ORDER — CLINDAMYCIN PHOSPHATE 900 MG/50ML IV SOLN
900.0000 mg | Freq: Three times a day (TID) | INTRAVENOUS | Status: AC
  Administered 2022-01-27 (×2): 900 mg via INTRAVENOUS
  Filled 2022-01-27 (×2): qty 50

## 2022-01-27 MED ORDER — ONDANSETRON HCL 4 MG/2ML IJ SOLN
INTRAMUSCULAR | Status: DC | PRN
  Administered 2022-01-27: 4 mg via INTRAVENOUS

## 2022-01-27 MED ORDER — CLINDAMYCIN PHOSPHATE 900 MG/50ML IV SOLN
INTRAVENOUS | Status: AC
  Filled 2022-01-27: qty 50

## 2022-01-27 MED ORDER — DEXAMETHASONE SODIUM PHOSPHATE 10 MG/ML IJ SOLN
INTRAMUSCULAR | Status: AC
  Filled 2022-01-27: qty 1

## 2022-01-27 MED ORDER — MORPHINE SULFATE (PF) 4 MG/ML IV SOLN: 4.0000 mg | INTRAVENOUS | Status: DC | PRN

## 2022-01-27 MED ORDER — PANTOPRAZOLE SODIUM 40 MG IV SOLR: 40.0000 mg | Freq: Every day | INTRAVENOUS | Status: DC

## 2022-01-27 MED ORDER — BACITRACIN ZINC 500 UNIT/GM EX OINT
TOPICAL_OINTMENT | CUTANEOUS | Status: DC | PRN
  Administered 2022-01-27: 1 via TOPICAL

## 2022-01-27 MED ORDER — ALUM & MAG HYDROXIDE-SIMETH 200-200-20 MG/5ML PO SUSP: 30.0000 mL | Freq: Four times a day (QID) | ORAL | Status: DC | PRN

## 2022-01-27 MED ORDER — CHLORHEXIDINE GLUCONATE 0.12 % MT SOLN
15.0000 mL | Freq: Once | OROMUCOSAL | Status: AC
  Administered 2022-01-27: 15 mL via OROMUCOSAL
  Filled 2022-01-27: qty 15

## 2022-01-27 MED ORDER — THROMBIN 5000 UNITS EX SOLR
CUTANEOUS | Status: AC
  Filled 2022-01-27: qty 5000

## 2022-01-27 MED ORDER — 0.9 % SODIUM CHLORIDE (POUR BTL) OPTIME
TOPICAL | Status: DC | PRN
  Administered 2022-01-27: 1000 mL

## 2022-01-27 MED ORDER — MIDAZOLAM HCL 2 MG/2ML IJ SOLN
INTRAMUSCULAR | Status: AC
  Filled 2022-01-27: qty 2

## 2022-01-27 MED ORDER — FLUTICASONE PROPIONATE 50 MCG/ACT NA SUSP
2.0000 | Freq: Every day | NASAL | Status: DC
  Filled 2022-01-27: qty 16

## 2022-01-27 MED ORDER — CLINDAMYCIN PHOSPHATE 900 MG/50ML IV SOLN
INTRAVENOUS | Status: DC | PRN
  Administered 2022-01-27: 900 mg via INTRAVENOUS

## 2022-01-27 MED ORDER — PANTOPRAZOLE SODIUM 40 MG PO TBEC
40.0000 mg | DELAYED_RELEASE_TABLET | Freq: Every day | ORAL | Status: DC
  Administered 2022-01-27: 40 mg via ORAL
  Filled 2022-01-27: qty 1

## 2022-01-27 MED ORDER — FENTANYL CITRATE (PF) 250 MCG/5ML IJ SOLN
INTRAMUSCULAR | Status: DC | PRN
  Administered 2022-01-27: 100 ug via INTRAVENOUS
  Administered 2022-01-27 (×6): 50 ug via INTRAVENOUS
  Administered 2022-01-27: 100 ug via INTRAVENOUS

## 2022-01-27 MED ORDER — LEVOCETIRIZINE DIHYDROCHLORIDE 5 MG PO TABS: 5.0000 mg | ORAL_TABLET | Freq: Every evening | ORAL | Status: DC

## 2022-01-27 SURGICAL SUPPLY — 56 items
BAG COUNTER SPONGE SURGICOUNT (BAG) ×1 IMPLANT
BAND RUBBER #18 3X1/16 STRL (MISCELLANEOUS) IMPLANT
BENZOIN TINCTURE PRP APPL 2/3 (GAUZE/BANDAGES/DRESSINGS) ×2 IMPLANT
BIT DRILL NEURO 2X3.1 SFT TUCH (MISCELLANEOUS) ×1 IMPLANT
BLADE SURG 15 STRL LF DISP TIS (BLADE) ×1 IMPLANT
BLADE SURG 15 STRL SS (BLADE) ×1
BLADE ULTRA TIP 2M (BLADE) ×1 IMPLANT
BUR BARREL STRAIGHT FLUTE 4.0 (BURR) ×1 IMPLANT
BUR MATCHSTICK NEURO 3.0 LAGG (BURR) ×1 IMPLANT
CANISTER SUCT 3000ML PPV (MISCELLANEOUS) ×1 IMPLANT
COVER MAYO STAND STRL (DRAPES) ×1 IMPLANT
DRAPE LAPAROTOMY 100X72 PEDS (DRAPES) ×1 IMPLANT
DRAPE MICROSCOPE SLANT 54X150 (MISCELLANEOUS) IMPLANT
DRAPE SURG 17X23 STRL (DRAPES) ×2 IMPLANT
DRILL NEURO 2X3.1 SOFT TOUCH (MISCELLANEOUS) ×1
DRSG OPSITE 4X5.5 SM (GAUZE/BANDAGES/DRESSINGS) IMPLANT
DRSG OPSITE POSTOP 3X4 (GAUZE/BANDAGES/DRESSINGS) ×1 IMPLANT
ELECT REM PT RETURN 9FT ADLT (ELECTROSURGICAL) ×1
ELECTRODE REM PT RTRN 9FT ADLT (ELECTROSURGICAL) ×1 IMPLANT
GAUZE 4X4 16PLY ~~LOC~~+RFID DBL (SPONGE) IMPLANT
GLOVE BIO SURGEON STRL SZ 6.5 (GLOVE) ×1 IMPLANT
GLOVE BIO SURGEON STRL SZ8 (GLOVE) ×1 IMPLANT
GLOVE BIO SURGEON STRL SZ8.5 (GLOVE) ×1 IMPLANT
GLOVE BIOGEL PI IND STRL 6.5 (GLOVE) ×1 IMPLANT
GLOVE EXAM NITRILE XL STR (GLOVE) IMPLANT
GOWN STRL REUS W/ TWL LRG LVL3 (GOWN DISPOSABLE) ×1 IMPLANT
GOWN STRL REUS W/ TWL XL LVL3 (GOWN DISPOSABLE) ×1 IMPLANT
GOWN STRL REUS W/TWL LRG LVL3 (GOWN DISPOSABLE) ×1
GOWN STRL REUS W/TWL XL LVL3 (GOWN DISPOSABLE) ×1
HEMOSTAT POWDER KIT SURGIFOAM (HEMOSTASIS) ×1 IMPLANT
KIT BASIN OR (CUSTOM PROCEDURE TRAY) ×1 IMPLANT
KIT TURNOVER KIT B (KITS) ×1 IMPLANT
MARKER SKIN DUAL TIP RULER LAB (MISCELLANEOUS) ×1 IMPLANT
NDL SPNL 18GX3.5 QUINCKE PK (NEEDLE) ×1 IMPLANT
NEEDLE HYPO 22GX1.5 SAFETY (NEEDLE) ×1 IMPLANT
NEEDLE SPNL 18GX3.5 QUINCKE PK (NEEDLE) ×1 IMPLANT
NS IRRIG 1000ML POUR BTL (IV SOLUTION) ×1 IMPLANT
PACK LAMINECTOMY NEURO (CUSTOM PROCEDURE TRAY) ×1 IMPLANT
PASTE DBM INTERGRO 2CC (Bone Implant) IMPLANT
PIN DISTRACTION 14MM (PIN) ×2 IMPLANT
PLATE TRESTLE LUXE 20L L1 (Plate) IMPLANT
SCREW 14MM (Screw) ×3 IMPLANT
SCREW BN 14X4.5XST VA (Screw) IMPLANT
SCREW CANN 4X16 SS S/DRILL (Screw) IMPLANT
SPACER IDENT 10X16X14 7D (Spacer) IMPLANT
SPACER IDENTITI 10X16X14 7D (Spacer) ×1 IMPLANT
SPIKE FLUID TRANSFER (MISCELLANEOUS) ×1 IMPLANT
SPONGE INTESTINAL PEANUT (DISPOSABLE) ×2 IMPLANT
SPONGE SURGIFOAM ABS GEL SZ50 (HEMOSTASIS) IMPLANT
STRIP CLOSURE SKIN 1/2X4 (GAUZE/BANDAGES/DRESSINGS) ×1 IMPLANT
SUT VIC AB 0 CT1 27 (SUTURE) ×1
SUT VIC AB 0 CT1 27XBRD ANTBC (SUTURE) ×1 IMPLANT
SUT VIC AB 3-0 SH 8-18 (SUTURE) ×1 IMPLANT
TOWEL GREEN STERILE (TOWEL DISPOSABLE) ×1 IMPLANT
TOWEL GREEN STERILE FF (TOWEL DISPOSABLE) ×1 IMPLANT
WATER STERILE IRR 1000ML POUR (IV SOLUTION) ×1 IMPLANT

## 2022-01-27 NOTE — Anesthesia Procedure Notes (Signed)
Procedure Name: Intubation Date/Time: 01/27/2022 7:41 AM  Performed by: Pearson Grippe, CRNAPre-anesthesia Checklist: Patient identified, Emergency Drugs available, Suction available and Patient being monitored Patient Re-evaluated:Patient Re-evaluated prior to induction Oxygen Delivery Method: Circle system utilized Preoxygenation: Pre-oxygenation with 100% oxygen Induction Type: IV induction Ventilation: Mask ventilation without difficulty Laryngoscope Size: Glidescope and 4 Grade View: Grade I Tube type: Oral Tube size: 7.5 mm Number of attempts: 1 Airway Equipment and Method: Stylet and Video-laryngoscopy Placement Confirmation: ETT inserted through vocal cords under direct vision, positive ETCO2 and breath sounds checked- equal and bilateral Secured at: 23 cm Tube secured with: Tape Dental Injury: Teeth and Oropharynx as per pre-operative assessment

## 2022-01-27 NOTE — Op Note (Signed)
Brief history: The patient is a 56 year old white male on whom another physician performed a C5-6 and C6-7 anterior cervicectomy fusion plating in 2018.  The patient's had chronic neck pain.  He failed medical management.  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  I discussed the various treatment options with him.  He has decided proceed with surgery.  Preoperative diagnosis: C6-7 pseudoarthrosis, spondylosis, stenosis, cervicalgia  Postoperative diagnosis: The same  Procedure: Exploration of cervical fusion/removal of cervical plate and screws from C5-C7; C6-7 redo anterior cervical discectomy/decompression; C6-7  arthrodesis with local morcellized autograft bone and Zimmer DBM; insertion of interbody prosthesis at C6-7 (Alphatec titanium interbody prosthesis); anterior cervical plating from C6-7 with Alphatec titanium plate  Surgeon: Dr. Delma Officer  Asst.: Hildred Priest, NP  Anesthesia: Gen. endotracheal  Estimated blood loss: 100 cc  Drains: None  Complications: None  Description of procedure: The patient was brought to the operating room by the anesthesia team. General endotracheal anesthesia was induced. A roll was placed under the patient's shoulders to keep the neck in the neutral position. The patient's anterior cervical region was then prepared with Betadine scrub and Betadine solution. Sterile drapes were applied.  The area to be incised was then injected with Marcaine with epinephrine solution. I then used a scalpel to make a transverse incision in the patient's left anterior neck, incising through his old surgical scar. I used the Metzenbaum scissors to dissect through scar tissue and to divide the platysmal muscle and then to dissect medial to the sternocleidomastoid muscle, jugular vein, and carotid artery. I carefully dissected down towards the anterior cervical spine identifying the esophagus and retracting it  medially. Then using Kitner swabs to clear soft tissue from the anterior cervical spine and to expose the old cervical plate from W0-J8.  We inserted the Caspar retractor for exposure.  We explored the fusion by removing the screws from the old plate and then removed the old plate from J1-B1.  The arthrodesis at C5-6 looked good.  He clearly had a pseudoarthrosis at C6-7.  We then incised the pseudoarthrosis/intervertebral disc at C6-7. We then remove some of the soft tissue pseudoarthrosis with with a pituitary forceps and the Karlin curettes. I then inserted distraction screws into the vertebral bodies at C6 and C7. We then distracted the interspace. We then used the high-speed drill to decorticate the vertebral endplates at C6-7, to drill away the remainder of the pseudoarthrosis, to drill away some posterior spondylosis, and to thin out the posterior longitudinal ligament. I then dissected through the epidural scar tissue with the small nerve office.  We then removed the ligament/epidural scar tissue with a Kerrison punches undercutting the vertebral endplates and decompressing the thecal sac.  As expected we encountered a large bone spur on the left.  We removed it with the pituitary forceps and the Kerrison punches.  We then performed foraminotomies about the bilateral C7 nerve roots. This completed the decompression at this level.  We now turned our to attention to the interbody fusion. We used the trial spacers to determine the appropriate size for the interbody prosthesis. We then pre-filled prosthesis with a combination of local morcellized autograft bone that we obtained during decompression as well as Zimmer DBM. We then inserted the prosthesis into the distracted interspace at C6-7. We then removed the distraction screws. There was a good snug fit of the prosthesis in the interspace.  Having completed the fusion we now turned  attention to the anterior spinal instrumentation. We used the  high-speed drill to drill away some anterior spondylosis at the disc spaces so that the plate lay down flat. We selected the appropriate length titanium anterior cervical plate. We laid it along the anterior aspect of the vertebral bodies from C6-7. We used the old screw holes at C6 and C7. We then secured the plate to the vertebral bodies by placing two 14 mm self-tapping screws at C6 and C7.  We got good bony purchase.. We then obtained intraoperative radiograph.  We could not see the plate or screws because of the patient's shoulders, but the construct looked good in vivo.  This completed the instrumentation.  We then obtained hemostasis using bipolar electrocautery. We irrigated the wound out with saline solution. We then removed the retractor. We inspected the esophagus for any damage. There was none apparent. We then reapproximated patient's platysmal muscle with interrupted 3-0 Vicryl suture. We then reapproximated the subcutaneous tissue with interrupted 3-0 Vicryl suture. The skin was reapproximated with Steri-Strips and benzoin. The wound was then covered with bacitracin ointment. A sterile dressing was applied. The drapes were removed. Patient was subsequently extubated by the anesthesia team and transported to the post anesthesia care unit in stable condition. All sponge instrument and needle counts were reportedly correct at the end of this case.

## 2022-01-27 NOTE — Progress Notes (Signed)
Orthopedic Tech Progress Note Patient Details:  Cameron [name] 1965/12/07 [id number]  PACU RN called requesting an ASPEN CERVICAL COLLAR   Patient ID: Cameron [name], male   DOB: 03/10/1965, 56 y.o.   MRN: [id number]  Donald Pore 01/27/2022, 12:27 PM

## 2022-01-27 NOTE — Progress Notes (Signed)
Subjective: The patient is alert and pleasant.  He is appropriately sore.  His wife is at the bedside.  Objective: Vital signs in last 24 hours: Temp:  [98 F (36.7 C)-98.5 F (36.9 C)] 98 F (36.7 C) (12/18 1133) Pulse Rate:  [71-112] 109 (12/18 1133) Resp:  [11-20] 20 (12/18 1133) BP: (122-143)/(86-93) 122/86 (12/18 1133) SpO2:  [94 %-96 %] 94 % (12/18 1133) Weight:  [83.9 kg] 83.9 kg (12/18 0538) Estimated body mass index is 28.13 kg/m as calculated from the following:   Height as of this encounter: 5\' 8"  (1.727 m).   Weight as of this encounter: 83.9 kg.   Intake/Output from previous day: No intake/output data recorded. Intake/Output this shift: Total I/O In: 1450 [I.V.:1400; IV Piggyback:50] Out: 100 [Blood:100]  Physical exam the patient is alert and pleasant.  He is moving all 4 extremities well.  His dressing is clean and dry.  There is no hematoma or shift.  Lab Results: No results for input(s): "WBC", "HGB", "HCT", "PLT" in the last 72 hours. BMET No results for input(s): "NA", "K", "CL", "CO2", "GLUCOSE", "BUN", "CREATININE", "CALCIUM" in the last 72 hours.  Studies/Results: DG Cervical Spine 1 View  Result Date: 01/27/2022 CLINICAL DATA:  Intraoperative imaging for ACDF, plate/screw 01/29/2022. Hardware removal. EXAM: DG CERVICAL SPINE - 1 VIEW COMPARISON:  Radiographs 09/27/2014 and CT 07/13/2021. FINDINGS: 0947 hours. Single intraoperative lateral view of the cervical spine demonstrates limited visualization inferior to C5. Previously demonstrated anterior plate and screws at C5-7 is no longer visualized. There are probable surgical sponges within the operative bed. Endotracheal and enteric tubes are in place. IMPRESSION: Intraoperative view during cervical spine surgery as described. There is limited visualization inferior to the C5-6 disc space on this single intraoperative view. Electronically Signed   By: 09/12/2021 M.D.   On: 01/27/2022 10:36     Assessment/Plan: The patient is doing well.  He will likely go home tomorrow.  LOS: 0 days     01/29/2022 01/27/2022, 3:12 PM

## 2022-01-27 NOTE — Anesthesia Preprocedure Evaluation (Signed)
Anesthesia Evaluation  Patient identified by MRN, date of birth, ID band Patient awake    Reviewed: Allergy & Precautions, H&P , NPO status , Patient's Chart, lab work & pertinent test results  Airway Mallampati: II   Neck ROM: limited    Dental   Pulmonary asthma    breath sounds clear to auscultation       Cardiovascular hypertension,  Rhythm:regular Rate:Normal     Neuro/Psych    GI/Hepatic   Endo/Other  diabetes, Type 2    Renal/GU      Musculoskeletal  (+) Arthritis ,    Abdominal   Peds  Hematology   Anesthesia Other Findings   Reproductive/Obstetrics                             Anesthesia Physical Anesthesia Plan  ASA: 2  Anesthesia Plan: General   Post-op Pain Management:    Induction: Intravenous  PONV Risk Score and Plan: 2 and Ondansetron, Dexamethasone, Midazolam and Treatment may vary due to age or medical condition  Airway Management Planned: Oral ETT and Video Laryngoscope Planned  Additional Equipment:   Intra-op Plan:   Post-operative Plan: Extubation in OR  Informed Consent: I have reviewed the patients History and Physical, chart, labs and discussed the procedure including the risks, benefits and alternatives for the proposed anesthesia with the patient or authorized representative who has indicated his/her understanding and acceptance.     Dental advisory given  Plan Discussed with: CRNA, Anesthesiologist and Surgeon  Anesthesia Plan Comments:        Anesthesia Quick Evaluation

## 2022-01-27 NOTE — Transfer of Care (Signed)
Immediate Anesthesia Transfer of Care Note  Patient: Cameron [name]  Procedure(s) Performed: ACDF,IP,PLATE/SCREWS A07;MAUQJFH FUSION;REM CERV HARDWARE (Spine Cervical)  Patient Location: PACU  Anesthesia Type:General  Level of Consciousness: awake, alert , and oriented  Airway & Oxygen Therapy: Patient Spontanous Breathing and Patient connected to face mask oxygen  Post-op Assessment: Report given to RN and Post -op Vital signs reviewed and stable  Post vital signs: Reviewed and stable  Last Vitals:  Vitals Value Taken Time  BP 144/78   Temp    Pulse 78   Resp 14   SpO2 98%     Last Pain:  Vitals:   01/27/22 0601  TempSrc:   PainSc: 7       Patients Stated Pain Goal: 2 (01/27/22 0601)  Complications: No notable events documented.

## 2022-01-27 NOTE — H&P (Signed)
Subjective: The patient is a 56 year old white male on whom Dr. Lawerance Cruel performed a C5-6 and C6-7 anterior cervicectomy fusion plating on 12/19/2011.  He has complained of chronic neck pain.  He has failed medical management.  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  I discussed the various treatment options with him.  He has decided to proceed with surgery.  Past Medical History:  Diagnosis Date   Arthritis    generalized   Asthma    Diabetes mellitus without complication (HCC)    type 2   History of kidney stones    Hypertension    Pneumonia     Past Surgical History:  Procedure Laterality Date   BACK SURGERY     neck 2011 and 2013   HERNIA REPAIR     left inguinal hernia repair as a child   JOINT REPLACEMENT Left    total joint replacement   KNEE ARTHROSCOPY Left    NASAL SEPTUM SURGERY  1986   REPAIR / REINSERT BICEPS TENDON AT ELBOW Right 2023   ROTATOR CUFF REPAIR Left    and bicep repair   TOTAL HIP ARTHROPLASTY Left     Allergies  Allergen Reactions   Molds & Smuts Shortness Of Breath   Penicillins Hives and Rash   Vancomycin Hives and Shortness Of Breath   Pineapple Rash   Ibuprofen Itching    Scalp, Head itching    Pecan Nut (Diagnostic) Itching    Mouth sores/throat itches   Latex Rash    Social History   Tobacco Use   Smoking status: Never   Smokeless tobacco: Never  Substance Use Topics   Alcohol use: Yes    Comment: rarely    History reviewed. No pertinent family history. Prior to Admission medications   Medication Sig Start Date End Date Taking? Authorizing Provider  acetaminophen (TYLENOL) 500 MG tablet Take 100 mg by mouth every 8 (eight) hours as needed for moderate pain.   Yes [provider]  albuterol (VENTOLIN HFA) 108 (90 Base) MCG/ACT inhaler Inhale into the lungs every 6 (six) hours as needed for wheezing or shortness of breath.   Yes  [provider]  atorvastatin (LIPITOR) 40 MG tablet Take 20 mg by mouth at bedtime. 11/05/21  Yes [provider]  fluticasone (FLONASE) 50 MCG/ACT nasal spray Place 2 sprays into both nostrils daily. 02/06/12  Yes [provider]  Fluticasone-Umeclidin-Vilant (TRELEGY ELLIPTA) 200-62.5-25 MCG/ACT AEPB Inhale 1 puff into the lungs daily. 12/17/20  Yes [provider]  gabapentin (NEURONTIN) 300 MG capsule Take 300 mg by mouth 3 (three) times daily.   Yes [provider]  levocetirizine (XYZAL) 5 MG tablet Take 5 mg by mouth every evening. 03/19/21  Yes [provider]  montelukast (SINGULAIR) 10 MG tablet Take 10 mg by mouth at bedtime. 07/15/17  Yes [provider]  Olopatadine HCl (PATADAY) 0.2 % SOLN Place 1 drop into both eyes 2 (two) times daily as needed (eye allergies).   Yes [provider]  rizatriptan (MAXALT) 10 MG tablet Take 10 mg by mouth as needed for migraine. May repeat in 2 hours if needed   Yes [provider]  Semaglutide, 1 MG/DOSE, 4 MG/3ML SOPN Inject 1 mg into the skin every Wednesday.   Yes [provider]  traZODone (DESYREL) 50 MG tablet Take 125 mg by mouth at bedtime. 06/03/21  Yes [provider]  venlafaxine XR (EFFEXOR-XR) 150 MG 24 hr capsule Take 150 mg by mouth daily with breakfast. 05/24/21  Yes [provider]  budesonide-formoterol (SYMBICORT) 160-4.5 MCG/ACT inhaler Inhale 2 puffs into the lungs 2 (two) times daily as needed (Asthma).    [provider]     Review of Systems  Positive ROS: As above  All other systems have been reviewed and were otherwise negative with the exception of those mentioned in the HPI and as above.  Objective: Vital signs in last 24 hours: Temp:  [98.4 F (36.9 C)] 98.4 F (36.9 C) (12/18 0538) Pulse Rate:  [71] 71 (12/18 0538) Resp:  [17] 17 (12/18 0538) BP: (132)/(86) 132/86 (12/18 0538) SpO2:  [95 %] 95 % (12/18  0538) Weight:  [83.9 kg] 83.9 kg (12/18 0538) Estimated body mass index is 28.13 kg/m as calculated from the following:   Height as of this encounter: 5\' 8"  (1.727 m).   Weight as of this encounter: 83.9 kg.   General Appearance: Alert Head: Normocephalic, without obvious abnormality, atraumatic Eyes: PERRL, conjunctiva/corneas clear, EOM's intact,    Ears: Normal  Throat: Normal  Neck: The patient's left anterior cervical incision is well-healed.  He has limited cervical range of motion. Back: unremarkable Lungs: Clear to auscultation bilaterally, respirations unlabored Heart: Regular rate and rhythm, no murmur, rub or gallop Abdomen: Soft, non-tender Extremities: Extremities normal, atraumatic, no cyanosis or edema Skin: unremarkable  NEUROLOGIC:   Mental status: alert and oriented,Motor Exam - grossly normal Sensory Exam - grossly normal Reflexes:  Coordination - grossly normal Gait - grossly normal Balance - grossly normal Cranial Nerves: I: smell Not tested  II: visual acuity  OS: Normal  OD: Normal   II: visual fields Full to confrontation  II: pupils Equal, round, reactive to light  III,VII: ptosis None  III,IV,VI: extraocular muscles  Full ROM  V: mastication Normal  V: facial light touch sensation  Normal  V,VII: corneal reflex  Present  VII: facial muscle function - upper  Normal  VII: facial muscle function - lower Normal  VIII: hearing Not tested  IX: soft palate elevation  Normal  IX,X: gag reflex Present  XI: trapezius strength  5/5  XI: sternocleidomastoid strength 5/5  XI: neck flexion strength  5/5  XII: tongue strength  Normal    Data Review Lab Results  Component Value Date   WBC 6.5 01/20/2022   HGB 14.3 01/20/2022   HCT 44.0 01/20/2022   MCV 94.0 01/20/2022   PLT 187 01/20/2022   Lab Results  Component Value Date   NA 139 01/20/2022   K 4.2 01/20/2022   CL 103 01/20/2022   CO2 28 01/20/2022   BUN 15 01/20/2022   CREATININE 0.86  01/20/2022   GLUCOSE 105 (H) 01/20/2022   No results found for: "INR", "PROTIME"  Assessment/Plan: Cervical spondylosis, cervicalgia, cervical pseudoarthrosis: I have discussed the situation with the patient.  I reviewed his imaging studies with him and pointed out the abnormalities.  We have discussed the various treatment options including surgery.  I have described the surgical treatment option of a exploration of the cervical fusion with a C6-7 redo anterior cervical discectomy fusion and plating.  I have shown him surgical models.  I have given him a surgical pamphlet.  We have discussed the risk, benefits, alternatives, expected postop course, and likelihood of achieving our goals with surgery.  I have answered all his questions.  He has decided proceed with surgery.   14/12/2021  01/27/2022 7:18 AM

## 2022-01-28 LAB — GLUCOSE, CAPILLARY: Glucose-Capillary: 155 mg/dL — ABNORMAL HIGH (ref 70–99)

## 2022-01-28 MED ORDER — OXYCODONE-ACETAMINOPHEN 5-325 MG PO TABS
1.0000 | ORAL_TABLET | ORAL | Status: DC | PRN
  Administered 2022-01-28: 2 via ORAL
  Filled 2022-01-28: qty 2

## 2022-01-28 MED ORDER — CYCLOBENZAPRINE HCL 10 MG PO TABS: 10.0000 mg | ORAL_TABLET | Freq: Three times a day (TID) | ORAL | 0 refills | Status: DC | PRN

## 2022-01-28 MED ORDER — OXYCODONE-ACETAMINOPHEN 5-325 MG PO TABS: 1.0000 | ORAL_TABLET | ORAL | 0 refills | Status: DC | PRN

## 2022-01-28 MED ORDER — DOCUSATE SODIUM 100 MG PO CAPS: 100.0000 mg | ORAL_CAPSULE | Freq: Two times a day (BID) | ORAL | 0 refills | Status: DC

## 2022-01-28 NOTE — Evaluation (Signed)
Occupational Therapy Evaluation and Discharge Patient Details Name: Cameron [name] MRN: [id number] DOB: January 13, 1966 Today's Date: 01/28/2022   History of Present Illness Cameron [name] is a 56 yo male that is now s/p exploration of cervical fusion/removal of cervical plate and screws from C5-C7; C6-7 redo anterior cervical discectomy/decompression due to cervical spondylosis and pseudoarthrosis at C6-7.   Clinical Impression   This 56 yo male admitted with above presents to acute OT with all education completed with pt and family. Acute OT will sign off.      Recommendations for follow up therapy are one component of a multi-disciplinary discharge planning process, led by the attending physician.  Recommendations may be updated based on patient status, additional functional criteria and insurance authorization.   Follow Up Recommendations  No OT follow up     Assistance Recommended at Discharge PRN     Functional Status Assessment  Patient has had a recent decline in their functional status and demonstrates the ability to make significant improvements in function in a reasonable and predictable amount of time. (but without further need for OT services)  Equipment Recommendations  None recommended by OT       Precautions / Restrictions Precautions Precautions: Cervical Precaution Booklet Issued: Yes (comment) Required Braces or Orthoses: Cervical Brace Cervical Brace: Hard collar;At all times (off for showering) Restrictions Weight Bearing Restrictions: No      Mobility Bed Mobility Overal bed mobility: Modified Independent                  Transfers Overall transfer level: Independent                        Balance Overall balance assessment: History of Falls (pt reports he still has a weak hip from surgery 3 years ago and other back issues (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 Uses a SPC at times)                                          ADL either performed or assessed with clinical judgement   ADL Overall ADL's : Modified independent                                       General ADL Comments: Educated on 2 cups for brushing teeth, use of straws when drinking from cups, sequence of UBD, c-collar wear and care.     Vision Patient Visual Report: No change from baseline              Pertinent Vitals/Pain Pain Assessment Pain Assessment: Faces Faces Pain Scale: Hurts a little bit Pain Location: neck Pain Descriptors / Indicators: Sore, Aching Pain Intervention(s): Limited activity within patient's tolerance     Hand Dominance Right (recent elbow surgery)   Extremity/Trunk Assessment Upper Extremity Assessment Upper Extremity Assessment: RUE deficits/detail RUE Deficits / Details: recent elbow surgery in hinged brace           Communication Communication Communication: No difficulties   Cognition Arousal/Alertness: Awake/alert Behavior During Therapy: WFL for tasks assessed/performed Overall Cognitive Status: Within Functional Limits for tasks assessed  Home Living Family/patient expects to be discharged to:: Private residence Living Arrangements: Spouse/significant other Available Help at Discharge: Family;Available 24 hours/day Type of Home: House       Home Layout: One level     Bathroom Shower/Tub: Producer, television/film/video: Standard     Home Equipment: Hand held shower head;Grab bars - tub/shower;Grab bars - toilet          Prior Functioning/Environment Prior Level of Function : Independent/Modified Independent                        OT Problem List: Decreased range of motion;Impaired balance (sitting and/or standing);Pain         OT Goals(Current goals can be found in the care plan section) Acute Rehab OT Goals Patient Stated Goal: home today          AM-PAC OT "6 Clicks" Daily Activity     Outcome Measure Help from another person eating meals?: None Help from another person taking care of personal grooming?: None Help from another person toileting, which includes using toliet, bedpan, or urinal?: None Help from another person bathing (including washing, rinsing, drying)?: None Help from another person to put on and taking off regular upper body clothing?: None Help from another person to put on and taking off regular lower body clothing?: None 6 Click Score: 24   End of Session Equipment Utilized During Treatment: Cervical collar Nurse Communication:  (no further OT nor PT needs)  Activity Tolerance: Patient tolerated treatment well Patient left:  (sitting at EOB)  OT Visit Diagnosis: Other abnormalities of gait and mobility (R26.89);History of falling (Z91.81);Pain Pain - part of body:  (incisional)                Time: 6063-0160 OT Time Calculation (min): 24 min Charges:  OT General Charges $OT Visit: 1 Visit OT Evaluation $OT Eval Moderate Complexity: 1 Mod OT Treatments $Self Care/Home Management : 8-22 mins  Ignacia Palma, OTR/L [facility] Re[facility] [phone] Office [phone]    Evette Georges 01/28/2022, 9:49 AM

## 2022-01-28 NOTE — Anesthesia Postprocedure Evaluation (Signed)
Anesthesia Post Note  Patient: Cameron [name]  Procedure(s) Performed: ACDF,IP,PLATE/SCREWS P71;GGYIRSW FUSION;REM CERV HARDWARE (Spine Cervical)     Patient location during evaluation: PACU Anesthesia Type: General Level of consciousness: awake and alert Pain management: pain level controlled Vital Signs Assessment: post-procedure vital signs reviewed and stable Respiratory status: spontaneous breathing, nonlabored ventilation, respiratory function stable and patient connected to nasal cannula oxygen Cardiovascular status: blood pressure returned to baseline and stable Postop Assessment: no apparent nausea or vomiting Anesthetic complications: no   No notable events documented.  Last Vitals:  Vitals:   01/28/22 0447 01/28/22 0751  BP: 118/85 134/77  Pulse: 89 (!) 107  Resp: 16 18  Temp: 36.6 C 36.4 C  SpO2: 94% 95%    Last Pain:  Vitals:   01/28/22 0751  TempSrc: Oral  PainSc:                  Timberlyn Pickford S

## 2022-01-28 NOTE — Progress Notes (Signed)
Patient alert and oriented, mae's well, voiding adequate amount of urine, swallowing without difficulty, no c/o pain at time of discharge. Patient discharged home with family. Script and discharged instructions given to patient. Patient and family stated understanding of instructions given. Patient has an appointment with Dr. Jenkins   

## 2022-01-28 NOTE — Discharge Summary (Signed)
Physician Discharge Summary  Patient ID: Cameron [name] MRN: [id number] DOB/AGE: [birth year] 56 y.o.  Admit date: 01/27/2022 Discharge date: 01/28/2022  Admission Diagnoses: Cervical pseudoarthrosis, cervicalgia  Discharge Diagnoses: The same Principal Problem:   Cervical pseudoarthrosis [facility])   Discharged Condition: good  Hospital Course: I performed an exploration of the patient cervical fusion, removal of old cervical plate and a A8-3 redo instrumentation and fusion on the patient on 01/27/2022.  The surgery went well.  The patient's postoperative course was unremarkable.  On postoperative day 1 he felt well and requested discharge to home.  The patient, and his wife, were given verbal and written discharge instructions.  All their questions were answered.  Consults: PT, OT, care management Significant Diagnostic Studies: None Treatments: Exploration of cervical fusion/removal cervical hardware; C6-7 anterior cervical discectomy fusion and plating Discharge Exam: Blood pressure 118/85, pulse 89, temperature 97.9 F (36.6 C), temperature source Oral, resp. rate 16, height 5\' 8"  (1.727 m), weight 83.9 kg, SpO2 94 %. The patient is alert and pleasant.  He looks well.  His strength is normal.  His dressing is clean and dry.  There is no hematoma or shift.  Disposition: Home  Discharge Instructions     Call MD for:  difficulty breathing, headache or visual disturbances   Complete by: As directed    Call MD for:  extreme fatigue   Complete by: As directed    Call MD for:  hives   Complete by: As directed    Call MD for:  persistant dizziness or light-headedness   Complete by: As directed    Call MD for:  persistant nausea and vomiting   Complete by: As directed    Call MD for:  redness, tenderness, or signs of infection (pain, swelling, redness, odor or green/yellow discharge around incision site)   Complete by: As directed    Call MD for:  severe uncontrolled pain    Complete by: As directed    Call MD for:  temperature >100.4   Complete by: As directed    Diet - low sodium heart healthy   Complete by: As directed    Discharge instructions   Complete by: As directed    Call [phone] for a followup appointment. Take a stool softener while you are using pain medications.   Driving Restrictions   Complete by: As directed    Do not drive for 2 weeks.   Increase activity slowly   Complete by: As directed    Lifting restrictions   Complete by: As directed    Do not lift more than 5 pounds. No excessive bending or twisting.   May shower / Bathe   Complete by: As directed    Remove the dressing for 3 days after surgery.  You may shower, but leave the incision alone.   Remove dressing in 48 hours   Complete by: As directed       Allergies as of 01/28/2022       Reactions   Molds & Smuts Shortness Of Breath   Penicillins Hives, Rash   Vancomycin Hives, Shortness Of Breath   Pineapple Rash   Ibuprofen Itching   Scalp, Head itching    Pecan Nut (diagnostic) Itching   Mouth sores/throat itches   Latex Rash        Medication List     STOP taking these medications    acetaminophen 500 MG tablet Commonly known as: TYLENOL       TAKE these medications  albuterol 108 (90 Base) MCG/ACT inhaler Commonly known as: VENTOLIN HFA Inhale into the lungs every 6 (six) hours as needed for wheezing or shortness of breath.   atorvastatin 40 MG tablet Commonly known as: LIPITOR Take 20 mg by mouth at bedtime.   budesonide-formoterol 160-4.5 MCG/ACT inhaler Commonly known as: SYMBICORT Inhale 2 puffs into the lungs 2 (two) times daily as needed (Asthma).   cyclobenzaprine 10 MG tablet Commonly known as: FLEXERIL Take 1 tablet (10 mg total) by mouth 3 (three) times daily as needed for muscle spasms.   docusate sodium 100 MG capsule Commonly known as: COLACE Take 1 capsule (100 mg total) by mouth 2 (two) times daily.   fluticasone 50  MCG/ACT nasal spray Commonly known as: FLONASE Place 2 sprays into both nostrils daily.   gabapentin 300 MG capsule Commonly known as: NEURONTIN Take 300 mg by mouth 3 (three) times daily.   levocetirizine 5 MG tablet Commonly known as: XYZAL Take 5 mg by mouth every evening.   montelukast 10 MG tablet Commonly known as: SINGULAIR Take 10 mg by mouth at bedtime.   oxyCODONE-acetaminophen 5-325 MG tablet Commonly known as: PERCOCET/ROXICET Take 1-2 tablets by mouth every 4 (four) hours as needed for moderate pain.   Pataday 0.2 % Soln Generic drug: Olopatadine HCl Place 1 drop into both eyes 2 (two) times daily as needed (eye allergies).   rizatriptan 10 MG tablet Commonly known as: MAXALT Take 10 mg by mouth as needed for migraine. May repeat in 2 hours if needed   Semaglutide (1 MG/DOSE) 4 MG/3ML Sopn Inject 1 mg into the skin every Wednesday.   traZODone 50 MG tablet Commonly known as: DESYREL Take 125 mg by mouth at bedtime.   Trelegy Ellipta 200-62.5-25 MCG/ACT Aepb Generic drug: Fluticasone-Umeclidin-Vilant Inhale 1 puff into the lungs daily.   venlafaxine XR 150 MG 24 hr capsule Commonly known as: EFFEXOR-XR Take 150 mg by mouth daily with breakfast.         Signed: Cristi Loron 01/28/2022, 7:49 AM

## 2022-01-29 ENCOUNTER — Encounter (HOSPITAL_COMMUNITY): Payer: Self-pay | Admitting: Neurosurgery

## 2022-07-03 ENCOUNTER — Encounter: Payer: Self-pay | Admitting: Gastroenterology

## 2022-10-22 ENCOUNTER — Ambulatory Visit (INDEPENDENT_AMBULATORY_CARE_PROVIDER_SITE_OTHER): Payer: No Typology Code available for payment source | Admitting: Gastroenterology

## 2022-10-22 ENCOUNTER — Encounter: Payer: Self-pay | Admitting: Gastroenterology

## 2022-10-22 ENCOUNTER — Other Ambulatory Visit (INDEPENDENT_AMBULATORY_CARE_PROVIDER_SITE_OTHER): Payer: No Typology Code available for payment source

## 2022-10-22 VITALS — BP 130/86 | HR 76 | Ht 68.0 in | Wt 217.0 lb

## 2022-10-22 DIAGNOSIS — R1319 Other dysphagia: Secondary | ICD-10-CM | POA: Diagnosis not present

## 2022-10-22 DIAGNOSIS — Z1211 Encounter for screening for malignant neoplasm of colon: Secondary | ICD-10-CM | POA: Insufficient documentation

## 2022-10-22 DIAGNOSIS — Z8719 Personal history of other diseases of the digestive system: Secondary | ICD-10-CM

## 2022-10-22 DIAGNOSIS — R194 Change in bowel habit: Secondary | ICD-10-CM

## 2022-10-22 DIAGNOSIS — K5909 Other constipation: Secondary | ICD-10-CM

## 2022-10-22 DIAGNOSIS — R140 Abdominal distension (gaseous): Secondary | ICD-10-CM | POA: Diagnosis not present

## 2022-10-22 DIAGNOSIS — R1310 Dysphagia, unspecified: Secondary | ICD-10-CM

## 2022-10-22 LAB — TSH: TSH: 1.22 u[IU]/mL (ref 0.35–5.50)

## 2022-10-22 LAB — COMPREHENSIVE METABOLIC PANEL
ALT: 20 U/L (ref 0–53)
AST: 18 U/L (ref 0–37)
Albumin: 4.3 g/dL (ref 3.5–5.2)
Alkaline Phosphatase: 85 U/L (ref 39–117)
BUN: 11 mg/dL (ref 6–23)
CO2: 30 meq/L (ref 19–32)
Calcium: 9.6 mg/dL (ref 8.4–10.5)
Chloride: 101 meq/L (ref 96–112)
Creatinine, Ser: 0.97 mg/dL (ref 0.40–1.50)
GFR: 88.32 mL/min (ref >60.00)
Glucose, Bld: 84 mg/dL (ref 70–99)
Potassium: 4.6 meq/L (ref 3.5–5.1)
Sodium: 139 meq/L (ref 135–145)
Total Bilirubin: 0.6 mg/dL (ref 0.2–1.2)
Total Protein: 7.6 g/dL (ref 6.0–8.3)

## 2022-10-22 LAB — CBC
HCT: 43.6 % (ref 39.0–52.0)
Hemoglobin: 14.3 g/dL (ref 13.0–17.0)
MCHC: 32.9 g/dL (ref 30.0–36.0)
MCV: 90.8 fl (ref 78.0–100.0)
Platelets: 221 10*3/uL (ref 150.0–400.0)
RBC: 4.8 Mil/uL (ref 4.22–5.81)
RDW: 12.7 % (ref 11.5–15.5)
WBC: 7.4 10*3/uL (ref 4.0–10.5)

## 2022-10-22 NOTE — Patient Instructions (Addendum)
Your provider has requested that you go to the basement level for lab work before leaving today. Press "B" on the elevator. The lab is located at the first door on the left as you exit the elevator.   You have been scheduled for an endoscopy and colonoscopy. Please follow the written instructions given to you at your visit today.  Please pick up your prep supplies at the pharmacy within the next 1-3 days.  If you use inhalers (even only as needed), please bring them with you on the day of your procedure.  DO NOT TAKE 7 DAYS PRIOR TO TEST- Trulicity (dulaglutide) Ozempic, Wegovy (semaglutide) Mounjaro (tirzepatide) Bydureon Bcise (exanatide extended release)  DO NOT TAKE 1 DAY PRIOR TO YOUR TEST Rybelsus (semaglutide) Adlyxin (lixisenatide) Victoza (liraglutide) Byetta (exanatide) ___________________________________________________________________________  Sample of Plenvu given to you today to use for Colonoscopy preparation.    We have given you samples of the following medication to take: Linzess 145 mcg- Take 1 pill once daily. If no improvement with once daily dosing, then in increase to 2 pills once daily. Let us know if this help. Prescription can be sent at that time. ( Only start Linzess if Miralax and colace do not work. )  Okay to increase Miralax to twice daily and use Colace 200 mg once daily.   You have been scheduled for a CT scan of the abdomen and pelvis at [facility] Address: [address], [place], [place] [address] .You are scheduled on 11/17/22  at 9:30 am . You should arrive 15  minutes prior to your appointment time for registration.  We are giving you 2 bottles of contrast today that you will need to drink before arriving for the exam. The solution may taste better if refrigerated so put them in the refrigerator when you get home, but do NOT add ice or any other liquid to this solution as that would dilute it. Shake  well before drinking.   Please follow the written instructions below on the day of your exam:   1) Do not eat anything after 5:30 am  (4 hours prior to your test)   2) Drink 1 bottle of contrast @ 7:30 am (2 hours prior to your exam)  Remember to shake well before drinking and do NOT pour over ice.     Drink 1 bottle of contrast @ 8:30 am (1 hour prior to your exam)   You may take any medications as prescribed with a small amount of water, if necessary. If you take any of the following medications: METFORMIN, GLUCOPHAGE, GLUCOVANCE, AVANDAMET, RIOMET, FORTAMET, ACTOPLUS MET, JANUMET, GLUMETZA or METAGLIP, you MAY be asked to HOLD this medication 48 hours AFTER the exam.   The purpose of you drinking the oral contrast is to aid in the visualization of your intestinal tract. The contrast solution may cause some diarrhea. Depending on your individual set of symptoms, you may also receive an intravenous injection of x-ray contrast/dye. Plan on being at  [facility] for 45 minutes or longer, depending on the type of exam you are having performed.   If you have any questions regarding your exam or if you need to reschedule, you may call Radiology at Phone: [phone]   Toileting tips to help with your constipation - Drink at least 64-80 ounces of water/liquid per day. - Establish a time to try to move your bowels every day.  For many people, this is after a cup of  coffee or after a meal such as breakfast. - Sit all of the way back on the toilet keeping your back fairly straight and while sitting up, try to rest the tops of your forearms on your upper thighs.   - Raising your feet with a step stool/squatty potty can be helpful to improve the angle that allows your stool to pass through the rectum. - Relax the rectum feeling it bulge toward the toilet water.  If you feel your rectum raising toward your body, you are contracting rather than relaxing. - Breathe in and slowly exhale. "Belly breath" by  expanding your belly towards your belly button. Keep belly expanded as you gently direct pressure down and back to the anus.  A low pitched GRRR sound can assist with increasing intra-abdominal pressure.  - Repeat 3-4 times. If unsuccessful, contract the pelvic floor to restore normal tone and get off the toilet.  Avoid excessive straining. - To reduce excessive wiping by teaching your anus to normally contract, place hands on outer aspect of knees and resist knee movement outward.  Hold 5-10 second then place hands just inside of knees and resist inward movement of knees.  Hold 5 seconds.  Repeat a few times each way.  Due to recent changes in healthcare laws, you may see the results of your imaging and laboratory studies on MyChart before your provider has had a chance to review them.  We understand that in some cases there may be results that are confusing or concerning to you. Not all laboratory results come back in the same time frame and the provider may be waiting for multiple results in order to interpret others.  Please give [place] 48 hours in order for your provider to thoroughly review all the results before contacting the office for clarification of your results.   Thank you for choosing me and  Gastroenterology.  Dr. Meridee Score

## 2022-10-22 NOTE — Progress Notes (Signed)
GASTROENTEROLOGY OUTPATIENT CLINIC VISIT   Primary Care Provider System, Provider Not In No address on file None  Referring Provider Benson Norway, DO [address] [place],  [place] [address] [phone]  Patient Profile: Cameron [name] is a 57 y.o. male with a pmh significant for hypertension, diabetes, asthma, anxiety, arthritis, nephrolithiasis, OSA, prior inguinal hernia repair, diverticulosis (with prior diverticulitis).  The patient presents to the [facility] for an evaluation and management of problem(s) noted below:  Problem List 1. Chronic constipation   2. Colon cancer screening   3. Change in bowel habits   4. History of diverticulitis   5. Bloating   6. Other dysphagia     History of Present Illness This is the patient's first visit to the outpatient [place] [facility] clinic.  He is a Cytogeneticist.  Patient presents as a community care evaluation for multiple GI issues.  His current issues include abdominal pain, bloating, alteration of bowel habits with constipation and diarrhea and prior diverticulosis/diverticulitis.  The patient underwent a colonoscopy and an endoscopy at an outside facility 10 to 11 years ago for evaluation of reported GI bleeding.  He states that the workup was unremarkable.  A year ago he began to experience significant changes in his bowel habits.  Normally he would use the restroom every 3 to 5 days and then it has become more of an every 8 to 10 days bowel movement..  As he gets closer to 8 or 9 days he can have considerable abdominal discomfort and bloating.  In January he had a reported possible rectal blockage and passed a large golf ball sized stool ball.  He drinks coffee and fluids of at least 64 ounces per day.  He uses MiraLAX as well as Metamucil and Colace as needed.  In 2023 there was a report of diverticulitis but he actually was never treated with antibiotics and follow-up imaging suggested that things were  okay.  He reports prior hemorrhoidal banding back in 2005 but has not had hemorrhoidal bleeding since.  He does have prior history of possible fissure and perianal inflammation.  He is hopeful additional management of his symptoms is possible.  He reports his thyroid function and blood counts have been normal as checked at the [place].  GI Review of Systems Positive as above Negative for pyrosis, dysphagia, odynophagia, nausea, vomiting, melena, hematochezia  Review of Systems General: Denies fevers/chills/weight loss unintentionally Cardiovascular: Denies chest pain Pulmonary: Denies shortness of breath Gastroenterological: See HPI Genitourinary: Denies darkened urine  Hematological: Denies easy bruising/bleeding Endocrine: Denies temperature intolerance Dermatological: Denies jaundice Psychological: Mood is stable   Medications Current Outpatient Medications  Medication Sig Dispense Refill   albuterol (VENTOLIN HFA) 108 (90 Base) MCG/ACT inhaler Inhale into the lungs every 6 (six) hours as needed for wheezing or shortness of breath.     atorvastatin (LIPITOR) 40 MG tablet Take 20 mg by mouth at bedtime.     docusate sodium (COLACE) 100 MG capsule Take 1 capsule (100 mg total) by mouth 2 (two) times daily. 30 capsule 0   fluticasone (FLONASE) 50 MCG/ACT nasal spray Place 2 sprays into both nostrils daily.     levocetirizine (XYZAL) 5 MG tablet Take 5 mg by mouth every evening.     metFORMIN (GLUCOPHAGE) 500 MG tablet Take 500 mg by mouth 2 (two) times daily with a meal.     mometasone-formoterol (DULERA) 100-5 MCG/ACT AERO Inhale 2 puffs into the lungs 2 (two) times daily.  montelukast (SINGULAIR) 10 MG tablet Take 10 mg by mouth at bedtime.     Olopatadine HCl (PATADAY) 0.2 % SOLN Place 1 drop into both eyes 2 (two) times daily as needed (eye allergies).     rizatriptan (MAXALT) 10 MG tablet Take 10 mg by mouth as needed for migraine. May repeat in 2 hours if needed     tiotropium  (SPIRIVA) 18 MCG inhalation capsule Place 18 mcg into inhaler and inhale daily.     traZODone (DESYREL) 50 MG tablet Take 125 mg by mouth at bedtime.     venlafaxine XR (EFFEXOR-XR) 150 MG 24 hr capsule Take 150 mg by mouth daily with breakfast.     No current facility-administered medications for this visit.    Allergies Allergies  Allergen Reactions   Molds & Smuts Shortness Of Breath   Penicillins Hives and Rash   Vancomycin Hives and Shortness Of Breath   Pineapple Rash   Ibuprofen Itching    Scalp, Head itching    Pecan Nut (Diagnostic) Itching    Mouth sores/throat itches   Latex Rash    Histories Past Medical History:  Diagnosis Date   Arthritis    generalized   Asthma    Diabetes mellitus without complication (HCC)    type 2   History of kidney stones    Hypertension    Pneumonia    Past Surgical History:  Procedure Laterality Date   ANTERIOR CERVICAL DECOMP/DISCECTOMY FUSION N/A 01/27/2022   Procedure: ACDF,IP,PLATE/SCREWS M01;UUVOZDG FUSION;REM CERV HARDWARE;  Surgeon: Tressie Stalker, MD;  Location: [facility] OR;  Service: Neurosurgery;  Laterality: N/A;  3C   BACK SURGERY     neck 2011 and 2013   HERNIA REPAIR     left inguinal hernia repair as a child   JOINT REPLACEMENT Left    total joint replacement   KNEE ARTHROSCOPY Left    NASAL SEPTUM SURGERY  1986   REPAIR / REINSERT BICEPS TENDON AT ELBOW Right 2023   ROTATOR CUFF REPAIR Left    and bicep repair   TOTAL HIP ARTHROPLASTY Left    Social History   Socioeconomic History   Marital status: Married    Spouse name: Not on file   Number of children: Not on file   Years of education: Not on file   Highest education level: Not on file  Occupational History   Not on file  Tobacco Use   Smoking status: Never   Smokeless tobacco: Never  Vaping Use   Vaping status: Never Used  Substance and Sexual Activity   Alcohol use: Yes    Comment: rarely   Drug use: Never   Sexual activity: Not on file   Other Topics Concern   Not on file  Social History Narrative   Not on file   Social Determinants of Health   Financial Resource Strain: Not on file  Food Insecurity: No Food Insecurity (07/30/2022)   Received from [facility]   Hunger Vital Sign    Worried About Running Out of Food in the Last Year: Never true    Ran Out of Food in the Last Year: Never true  Transportation Needs: Not on file  Physical Activity: Not on file  Stress: Not on file  Social Connections: Not on file  Intimate Partner Violence: Not on file   Family History  Problem Relation Age of Onset   Colon cancer Neg Hx    Esophageal cancer Neg Hx    Inflammatory bowel disease  Neg Hx    Liver disease Neg Hx    Pancreatic cancer Neg Hx    Rectal cancer Neg Hx    Stomach cancer Neg Hx    I have reviewed his medical, social, and family history in detail and updated the electronic medical record as necessary.    PHYSICAL EXAMINATION  BP 130/86   Pulse 76   Ht 5\' 8"  (1.727 m)   Wt 217 lb (98.4 kg)   BMI 32.99 kg/m  Wt Readings from Last 3 Encounters:  10/22/22 217 lb (98.4 kg)  01/27/22 185 lb (83.9 kg)  01/20/22 193 lb 12.8 oz (87.9 kg)  GEN: NAD, appears stated age, doesn't appear chronically ill PSYCH: Cooperative, without pressured speech EYE: Conjunctivae pink, sclerae anicteric ENT: MMM CV: Nontachycardic RESP: No audible wheezing GI: NABS, soft, protuberant abdomen, rounded, NT, without rebound or guarding GU: Perianal exam shows evidence of a small external skin tag and likely internal hemorrhoids without mass or lesion or fistula MSK/EXT: No significant lower extremity edema SKIN: No jaundice NEURO:  Alert & Oriented x 3, no focal deficits   REVIEW OF DATA  I reviewed the following data at the time of this encounter:  GI Procedures and Studies  Reports prior colonoscopy 10 years ago outside facility normal per report  Laboratory Studies  Reviewed those in epic  Imaging Studies   Reviewed those imaging studies in epic  Patient has brought in report of KUB from this year showed no evidence of functional blockage  2023 CT scan showing evidence of diverticulitis   ASSESSMENT  Mr. Stolzenburg is a 57 y.o. male with a pmh significant for hypertension, diabetes, asthma, anxiety, arthritis, nephrolithiasis, OSA, prior inguinal hernia repair, diverticulosis (with prior diverticulitis).  The patient is seen today for evaluation and management of:  1. Chronic constipation   2. Colon cancer screening   3. Change in bowel habits   4. History of diverticulitis   5. Bloating   6. Other dysphagia    The patient is hemodynamically stable.  Patient has longstanding history of constipation which has been exacerbated over the course of the last year.  Has a history of poorly diverticulitis without medication therapy being enacted back in 2023.  With his progressive change in bowel habits, it is reasonable to move forward with an updated cross-sectional imaging study just to ensure there have not been any changes such as stricture formation or other issues.  He has been many years since his last colonoscopy so although unlikely we would obviously also want to rule out any sort of mass or lesion.  Updated colonoscopy for colon cancer screening makes sense and for this changes in bowel habits.  He has had infrequent episodes of solid food dysphagia and based on his other medical comorbidities, it makes sense for an upper endoscopic evaluation to be done at the same time.  Will going to try to be aggressive with his bowel regimen which will outlined below.  The risks and benefits of endoscopic evaluation were discussed with the patient; these include but are not limited to the risk of perforation, infection, bleeding, missed lesions, lack of diagnosis, severe illness requiring hospitalization, as well as anesthesia and sedation related illnesses.  The patient and/or family is agreeable to proceed.   All patient questions were answered to the best of my ability, and the patient agrees to the aforementioned plan of action with follow-up as indicated.   PLAN  Laboratories as outlined below CTAP to be performed  EGD for diagnostic and therapeutic purposes of dysphagia Colonoscopy for colon cancer screening Increased MiraLAX to twice daily Colace 200 mg daily  Dulcolax 10 mg every other day if needed and no bowel movement after 3 days Samples of Linzess 145 mcg given -Consider using daily if still having issues with increased MiraLAX and Colace and Dulcolax dosing  -We can prescribe this or trial other medications as needed 2-day bowel preparation needed for colonoscopy   Orders Placed This Encounter  Procedures   Procedural/ Surgical Case Request: COLONOSCOPY WITH PROPOFOL, ESOPHAGOGASTRODUODENOSCOPY (EGD) WITH PROPOFOL   CT ABDOMEN PELVIS W CONTRAST   CBC   Comp Met (CMET)   TSH   IgA   Tissue transglutaminase, IgA   Ambulatory referral to Gastroenterology    New Prescriptions   No medications on file   Modified Medications   No medications on file    Planned Follow Up No follow-ups on file.   Total Time in Face-to-Face and in Coordination of Care for patient including independent/personal interpretation/review of prior testing, medical history, examination, medication adjustment, communicating results with the patient directly, and documentation within the EHR is 45 minutes.   Corliss Parish, MD [facility]oscopy Office # [phone]

## 2022-10-23 LAB — IGA: Immunoglobulin A: 287 mg/dL (ref 47–310)

## 2022-10-23 LAB — TISSUE TRANSGLUTAMINASE, IGA: (tTG) Ab, IgA: <1 U/mL

## 2022-11-17 ENCOUNTER — Ambulatory Visit
Admission: RE | Admit: 2022-11-17 | Discharge: 2022-11-17 | Disposition: A | Discharge status: Home / Self Care | Payer: Non-veteran care | Source: Ambulatory Visit | Attending: Gastroenterology | Admitting: Gastroenterology

## 2022-11-17 DIAGNOSIS — R194 Change in bowel habit: Secondary | ICD-10-CM | POA: Diagnosis present

## 2022-11-17 DIAGNOSIS — R140 Abdominal distension (gaseous): Secondary | ICD-10-CM

## 2022-11-17 DIAGNOSIS — R1319 Other dysphagia: Secondary | ICD-10-CM

## 2022-11-17 DIAGNOSIS — K5909 Other constipation: Secondary | ICD-10-CM

## 2022-11-17 DIAGNOSIS — Z8719 Personal history of other diseases of the digestive system: Secondary | ICD-10-CM

## 2022-11-17 MED ORDER — BARIUM SULFATE 2 % PO SUSP
900.0000 mL | Freq: Once | ORAL | Status: AC
  Administered 2022-11-17: 900 mL via ORAL

## 2022-11-17 MED ORDER — IOHEXOL 300 MG/ML  SOLN
100.0000 mL | Freq: Once | INTRAMUSCULAR | Status: AC | PRN
  Administered 2022-11-17: 100 mL via INTRAVENOUS

## 2022-12-08 ENCOUNTER — Other Ambulatory Visit: Payer: Self-pay

## 2022-12-08 ENCOUNTER — Encounter (HOSPITAL_COMMUNITY): Payer: Self-pay | Admitting: Gastroenterology

## 2022-12-15 NOTE — Anesthesia Preprocedure Evaluation (Signed)
Anesthesia Evaluation  Patient identified by MRN, date of birth, ID band Patient awake    Reviewed: Allergy & Precautions, H&P , NPO status , Patient's Chart, lab work & pertinent test results  Airway Mallampati: III  TM Distance: <3 FB Neck ROM: Limited    Dental  (+) Teeth Intact, Dental Advisory Given LOOKS ANTERIOR:   Pulmonary asthma , sleep apnea , pneumonia   breath sounds clear to auscultation       Cardiovascular hypertension, Pt. on medications  Rhythm:regular Rate:Normal     Neuro/Psych  PSYCHIATRIC DISORDERS Anxiety Depression       GI/Hepatic   Endo/Other  diabetes, Type 2, Oral Hypoglycemic Agents    Renal/GU      Musculoskeletal  (+) Arthritis ,    Abdominal   Peds  Hematology   Anesthesia Other Findings   Reproductive/Obstetrics                             Anesthesia Physical Anesthesia Plan  ASA: 3  Anesthesia Plan: MAC   Post-op Pain Management: Minimal or no pain anticipated   Induction: Intravenous  PONV Risk Score and Plan: 2 and Treatment may vary due to age or medical condition and Propofol infusion  Airway Management Planned: Natural Airway, Simple Face Mask and Mask  Additional Equipment:   Intra-op Plan:   Post-operative Plan: Extubation in OR  Informed Consent: I have reviewed the patients History and Physical, chart, labs and discussed the procedure including the risks, benefits and alternatives for the proposed anesthesia with the patient or authorized representative who has indicated his/her understanding and acceptance.     Dental advisory given  Plan Discussed with: CRNA, Anesthesiologist and Surgeon  Anesthesia Plan Comments:        Anesthesia Quick Evaluation

## 2022-12-16 ENCOUNTER — Ambulatory Visit (HOSPITAL_COMMUNITY): Payer: No Typology Code available for payment source | Admitting: Anesthesiology

## 2022-12-16 ENCOUNTER — Encounter (HOSPITAL_COMMUNITY): Payer: Self-pay | Admitting: Gastroenterology

## 2022-12-16 ENCOUNTER — Encounter (HOSPITAL_COMMUNITY)
Admission: RE | Disposition: A | Discharge status: Home / Self Care | Payer: Self-pay | Source: Home / Self Care | Attending: Gastroenterology

## 2022-12-16 ENCOUNTER — Ambulatory Visit (HOSPITAL_COMMUNITY)
Admission: RE | Admit: 2022-12-16 | Discharge: 2022-12-16 | Disposition: A | Discharge status: Home / Self Care | Payer: No Typology Code available for payment source | Attending: Gastroenterology | Admitting: Gastroenterology

## 2022-12-16 ENCOUNTER — Other Ambulatory Visit: Payer: Self-pay

## 2022-12-16 DIAGNOSIS — K641 Second degree hemorrhoids: Secondary | ICD-10-CM | POA: Diagnosis not present

## 2022-12-16 DIAGNOSIS — R140 Abdominal distension (gaseous): Secondary | ICD-10-CM

## 2022-12-16 DIAGNOSIS — K5909 Other constipation: Secondary | ICD-10-CM | POA: Insufficient documentation

## 2022-12-16 DIAGNOSIS — R1319 Other dysphagia: Secondary | ICD-10-CM

## 2022-12-16 DIAGNOSIS — Z79899 Other long term (current) drug therapy: Secondary | ICD-10-CM | POA: Insufficient documentation

## 2022-12-16 DIAGNOSIS — G4730 Sleep apnea, unspecified: Secondary | ICD-10-CM | POA: Diagnosis not present

## 2022-12-16 DIAGNOSIS — K3189 Other diseases of stomach and duodenum: Secondary | ICD-10-CM | POA: Diagnosis not present

## 2022-12-16 DIAGNOSIS — J45909 Unspecified asthma, uncomplicated: Secondary | ICD-10-CM | POA: Diagnosis not present

## 2022-12-16 DIAGNOSIS — I10 Essential (primary) hypertension: Secondary | ICD-10-CM | POA: Insufficient documentation

## 2022-12-16 DIAGNOSIS — Z7984 Long term (current) use of oral hypoglycemic drugs: Secondary | ICD-10-CM | POA: Diagnosis not present

## 2022-12-16 DIAGNOSIS — Z8719 Personal history of other diseases of the digestive system: Secondary | ICD-10-CM

## 2022-12-16 DIAGNOSIS — E119 Type 2 diabetes mellitus without complications: Secondary | ICD-10-CM | POA: Diagnosis not present

## 2022-12-16 DIAGNOSIS — Z1211 Encounter for screening for malignant neoplasm of colon: Secondary | ICD-10-CM

## 2022-12-16 DIAGNOSIS — K5730 Diverticulosis of large intestine without perforation or abscess without bleeding: Secondary | ICD-10-CM | POA: Insufficient documentation

## 2022-12-16 DIAGNOSIS — R194 Change in bowel habit: Secondary | ICD-10-CM

## 2022-12-16 DIAGNOSIS — R1310 Dysphagia, unspecified: Secondary | ICD-10-CM | POA: Diagnosis not present

## 2022-12-16 HISTORY — DX: Sleep apnea, unspecified: G47.30

## 2022-12-16 HISTORY — DX: Depression, unspecified: F32.A

## 2022-12-16 HISTORY — PX: BIOPSY: SHX5522

## 2022-12-16 HISTORY — DX: Malignant (primary) neoplasm, unspecified: C80.1

## 2022-12-16 HISTORY — PX: SAVORY DILATION: SHX5439

## 2022-12-16 HISTORY — PX: ESOPHAGOGASTRODUODENOSCOPY (EGD) WITH PROPOFOL: SHX5813

## 2022-12-16 HISTORY — PX: COLONOSCOPY WITH PROPOFOL: SHX5780

## 2022-12-16 HISTORY — DX: Anxiety disorder, unspecified: F41.9

## 2022-12-16 LAB — GLUCOSE, CAPILLARY: Glucose-Capillary: 109 mg/dL — ABNORMAL HIGH (ref 70–99)

## 2022-12-16 SURGERY — COLONOSCOPY WITH PROPOFOL
Anesthesia: Monitor Anesthesia Care

## 2022-12-16 MED ORDER — LIDOCAINE HCL (CARDIAC) PF 100 MG/5ML IV SOSY
PREFILLED_SYRINGE | INTRAVENOUS | Status: DC | PRN
  Administered 2022-12-16: 60 mg via INTRATRACHEAL

## 2022-12-16 MED ORDER — MEPERIDINE HCL 50 MG/ML IJ SOLN: 6.2500 mg | INTRAMUSCULAR | Status: DC | PRN

## 2022-12-16 MED ORDER — DEXMEDETOMIDINE HCL IN NACL 80 MCG/20ML IV SOLN
INTRAVENOUS | Status: DC | PRN
  Administered 2022-12-16: 8 ug via INTRAVENOUS

## 2022-12-16 MED ORDER — PROPOFOL 500 MG/50ML IV EMUL
INTRAVENOUS | Status: DC | PRN
  Administered 2022-12-16 (×3): 50 mg via INTRAVENOUS
  Administered 2022-12-16: 100 ug/kg/min via INTRAVENOUS

## 2022-12-16 MED ORDER — ACETAMINOPHEN 325 MG PO TABS: 325.0000 mg | ORAL_TABLET | ORAL | Status: DC | PRN

## 2022-12-16 MED ORDER — PROPOFOL 10 MG/ML IV BOLUS
INTRAVENOUS | Status: AC
Start: 2022-12-16 — End: ?
  Filled 2022-12-16: qty 20

## 2022-12-16 MED ORDER — ACETAMINOPHEN 160 MG/5ML PO SOLN: 325.0000 mg | ORAL | Status: DC | PRN

## 2022-12-16 MED ORDER — OXYCODONE HCL 5 MG PO TABS: 5.0000 mg | ORAL_TABLET | Freq: Once | ORAL | Status: DC | PRN

## 2022-12-16 MED ORDER — LACTATED RINGERS IV SOLN: INTRAVENOUS | Status: DC | PRN

## 2022-12-16 MED ORDER — FENTANYL CITRATE (PF) 100 MCG/2ML IJ SOLN: 25.0000 ug | INTRAMUSCULAR | Status: DC | PRN

## 2022-12-16 MED ORDER — SODIUM CHLORIDE 0.9 % IV SOLN: INTRAVENOUS | Status: DC

## 2022-12-16 MED ORDER — OXYCODONE HCL 5 MG/5ML PO SOLN: 5.0000 mg | Freq: Once | ORAL | Status: DC | PRN

## 2022-12-16 MED ORDER — ONDANSETRON HCL 4 MG/2ML IJ SOLN: 4.0000 mg | Freq: Once | INTRAMUSCULAR | Status: DC | PRN

## 2022-12-16 SURGICAL SUPPLY — 25 items
BLOCK BITE 60FR ADLT L/F BLUE (MISCELLANEOUS) ×2 IMPLANT
ELECT REM PT RETURN 9FT ADLT (ELECTROSURGICAL)
ELECTRODE REM PT RTRN 9FT ADLT (ELECTROSURGICAL) IMPLANT
FCP BXJMBJMB 240X2.8X (CUTTING FORCEPS)
FLOOR PAD 36X40 (MISCELLANEOUS) ×2
FORCEP RJ3 GP 1.8X160 W-NEEDLE (CUTTING FORCEPS) IMPLANT
FORCEPS BIOP RAD 4 LRG CAP 4 (CUTTING FORCEPS) IMPLANT
FORCEPS BIOP RJ4 240 W/NDL (CUTTING FORCEPS)
FORCEPS BXJMBJMB 240X2.8X (CUTTING FORCEPS) IMPLANT
INJECTOR/SNARE I SNARE (MISCELLANEOUS) IMPLANT
LUBRICANT JELLY 4.5OZ STERILE (MISCELLANEOUS) IMPLANT
MANIFOLD NEPTUNE II (INSTRUMENTS) IMPLANT
NDL SCLEROTHERAPY 25GX240 (NEEDLE) IMPLANT
NEEDLE SCLEROTHERAPY 25GX240 (NEEDLE)
PAD FLOOR 36X40 (MISCELLANEOUS) ×2 IMPLANT
PROBE APC STR FIRE (PROBE) IMPLANT
PROBE INJECTION GOLD (MISCELLANEOUS)
PROBE INJECTION GOLD 7FR (MISCELLANEOUS) IMPLANT
SNARE ROTATE MED OVAL 20MM (MISCELLANEOUS) IMPLANT
SNARE SHORT THROW 13M SML OVAL (MISCELLANEOUS) IMPLANT
SYR 50ML LL SCALE MARK (SYRINGE) IMPLANT
TRAP SPECIMEN MUCOUS 40CC (MISCELLANEOUS) IMPLANT
TUBING ENDO SMARTCAP PENTAX (MISCELLANEOUS) ×4 IMPLANT
TUBING IRRIGATION ENDOGATOR (MISCELLANEOUS) ×2 IMPLANT
WATER STERILE IRR 1000ML POUR (IV SOLUTION) IMPLANT

## 2022-12-16 NOTE — Anesthesia Postprocedure Evaluation (Signed)
Anesthesia Post Note  Patient: [name]  Procedure(s) Performed: COLONOSCOPY WITH PROPOFOL ESOPHAGOGASTRODUODENOSCOPY (EGD) WITH PROPOFOL BIOPSY SAVORY DILATION     Patient location during evaluation: PACU Anesthesia Type: MAC Level of consciousness: awake and alert Pain management: pain level controlled Vital Signs Assessment: post-procedure vital signs reviewed and stable Respiratory status: spontaneous breathing, nonlabored ventilation, respiratory function stable and patient connected to nasal cannula oxygen Cardiovascular status: stable and blood pressure returned to baseline Postop Assessment: no apparent nausea or vomiting Anesthetic complications: no   No notable events documented.  Last Vitals:  Vitals:   12/16/22 0900 12/16/22 0909  BP: 116/80 106/77  Pulse: (!) 58 (!) 59  Resp: 15 10  Temp:    SpO2: 98% 99%    Last Pain:  Vitals:   12/16/22 0909  TempSrc:   PainSc: 0-No pain                 Nycholas Rayner

## 2022-12-16 NOTE — H&P (Signed)
GASTROENTEROLOGY PROCEDURE H&P NOTE   Primary Care Physician: System, Provider Not In  HPI: Cameron [name] is a 57 y.o. male who presents for EGD/Colonoscopy for dysphagia, bloating, colon cancer screening and history of previous diverticulitis.  Past Medical History:  Diagnosis Date   Anxiety    Arthritis    generalized   Asthma    Cancer (HCC)    skin   Depression    Diabetes mellitus without complication (HCC)    type 2   History of kidney stones    Hypertension    Pneumonia    Sleep apnea    Past Surgical History:  Procedure Laterality Date   ANTERIOR CERVICAL DECOMP/DISCECTOMY FUSION N/A 01/27/2022   Procedure: ACDF,IP,PLATE/SCREWS O75;IEPPIRJ FUSION;REM CERV HARDWARE;  Surgeon: Tressie Stalker, MD;  Location: [facility] OR;  Service: Neurosurgery;  Laterality: N/A;  3C   BACK SURGERY     neck 2011 and 2013   CARPAL TUNNEL RELEASE Right    HERNIA REPAIR     left inguinal hernia repair as a child   JOINT REPLACEMENT Left    total joint replacement   KNEE ARTHROSCOPY Left    NASAL SEPTUM SURGERY  1986   REPAIR / REINSERT BICEPS TENDON AT ELBOW Right 2023   ROTATOR CUFF REPAIR Left    and bicep repair   TENDON REPAIR Right    elbow   TOTAL HIP ARTHROPLASTY Left    Current Facility-Administered Medications  Medication Dose Route Frequency Provider Last Rate Last Admin   0.9 %  sodium chloride infusion   Intravenous Continuous Mansouraty, Netty Starring., MD       acetaminophen (TYLENOL) tablet 325-650 mg  325-650 mg Oral Q4H PRN Bethena Midget, MD       Or   acetaminophen (TYLENOL) 160 MG/5ML solution 325-650 mg  325-650 mg Oral Q4H PRN Bethena Midget, MD       fentaNYL (SUBLIMAZE) injection 25-50 mcg  25-50 mcg Intravenous Q5 min PRN Bethena Midget, MD       meperidine (DEMEROL) injection 6.25-12.5 mg  6.25-12.5 mg Intravenous Q5 min PRN Bethena Midget, MD       ondansetron [facility]) injection 4 mg  4 mg Intravenous Once PRN Bethena Midget, MD       oxyCODONE (Oxy  IR/ROXICODONE) immediate release tablet 5 mg  5 mg Oral Once PRN Bethena Midget, MD       Or   oxyCODONE (ROXICODONE) 5 MG/5ML solution 5 mg  5 mg Oral Once PRN Bethena Midget, MD        Current Facility-Administered Medications:    0.9 %  sodium chloride infusion, , Intravenous, Continuous, Mansouraty, Netty Starring., MD   acetaminophen (TYLENOL) tablet 325-650 mg, 325-650 mg, Oral, Q4H PRN **OR** acetaminophen (TYLENOL) 160 MG/5ML solution 325-650 mg, 325-650 mg, Oral, Q4H PRN, Bethena Midget, MD   fentaNYL (SUBLIMAZE) injection 25-50 mcg, 25-50 mcg, Intravenous, Q5 min PRN, Bethena Midget, MD   meperidine (DEMEROL) injection 6.25-12.5 mg, 6.25-12.5 mg, Intravenous, Q5 min PRN, Bethena Midget, MD   ondansetron [facility]) injection 4 mg, 4 mg, Intravenous, Once PRN, Bethena Midget, MD   oxyCODONE (Oxy IR/ROXICODONE) immediate release tablet 5 mg, 5 mg, Oral, Once PRN **OR** oxyCODONE (ROXICODONE) 5 MG/5ML solution 5 mg, 5 mg, Oral, Once PRN, Bethena Midget, MD Allergies  Allergen Reactions   Molds & Smuts Shortness Of Breath   Penicillins Hives and Rash   Vancomycin Hives and Shortness Of Breath   Pineapple Rash   Ibuprofen Itching  Scalp, Head itching    Pecan Nut (Diagnostic) Itching    Mouth sores/throat itches   Latex Rash   Family History  Problem Relation Age of Onset   Colon cancer Neg Hx    Esophageal cancer Neg Hx    Inflammatory bowel disease Neg Hx    Liver disease Neg Hx    Pancreatic cancer Neg Hx    Rectal cancer Neg Hx    Stomach cancer Neg Hx    Social History   Socioeconomic History   Marital status: Married    Spouse name: Not on file   Number of children: Not on file   Years of education: Not on file   Highest education level: Not on file  Occupational History   Not on file  Tobacco Use   Smoking status: Never   Smokeless tobacco: Never  Vaping Use   Vaping status: Never Used  Substance and Sexual Activity   Alcohol use: Yes    Comment: rarely    Drug use: Never   Sexual activity: Not on file  Other Topics Concern   Not on file  Social History Narrative   Not on file   Social Determinants of Health   Financial Resource Strain: Not on file  Food Insecurity: No Food Insecurity (07/30/2022)   Received [facility]   Hunger Vital Sign    Worried About Running Out of Food in the Last Year: Never true    Ran Out of Food in the Last Year: Never true  Transportation Needs: Not on file  Physical Activity: Not on file  Stress: Not on file  Social Connections: Not on file  Intimate Partner Violence: Not on file    Physical Exam: Today's Vitals   12/16/22 0645  BP: (!) 139/91  Pulse: 65  Resp: 13  Temp: 98.3 F (36.8 C)  TempSrc: Oral  SpO2: 96%  Weight: 95.3 kg  Height: 5\' 9"  (1.753 m)  PainSc: 0-No pain   Body mass index is 31.01 kg/m. GEN: NAD EYE: Sclerae anicteric ENT: MMM CV: Non-tachycardic GI: Soft, NT/ND NEURO:  Alert & Oriented x 3  Lab Results: No results for input(s): "WBC", "HGB", "HCT", "PLT" in the last 72 hours. BMET No results for input(s): "NA", "K", "CL", "CO2", "GLUCOSE", "BUN", "CREATININE", "CALCIUM" in the last 72 hours. LFT No results for input(s): "PROT", "ALBUMIN", "AST", "ALT", "ALKPHOS", "BILITOT", "BILIDIR", "IBILI" in the last 72 hours. PT/INR No results for input(s): "LABPROT", "INR" in the last 72 hours.   Impression / Plan: This is a 57 y.o.male who presents for EGD/Colonoscopy for dysphagia, bloating, colon cancer screening and history of previous diverticulitis.  The risks and benefits of endoscopic evaluation/treatment were discussed with the patient and/or family; these include but are not limited to the risk of perforation, infection, bleeding, missed lesions, lack of diagnosis, severe illness requiring hospitalization, as well as anesthesia and sedation related illnesses.  The patient's history has been reviewed, patient examined, no change in status, and deemed  stable for procedure.  The patient and/or family is agreeable to proceed.    Corliss Parish, MD [facility]oscopy Office # [phone]

## 2022-12-16 NOTE — Op Note (Signed)
[facility] Patient Name: Cameron [name] Procedure Date: 12/16/2022 MRN: [id number] Attending MD: Corliss Parish , MD, [id number] Date of Birth: 1965-02-17 CSN: [id number] Age: 57 Admit Type: Outpatient Procedure:                Colonoscopy Indications:              Screening for colorectal malignant neoplasm,                            Incidental constipation noted Providers:                Corliss Parish, MD, Norman Clay, RN, Priscella Mann, Technician Referring MD:              Medicines:                Monitored Anesthesia Care Complications:            No immediate complications. Estimated Blood Loss:     Estimated blood loss: none. Procedure:                Pre-Anesthesia Assessment:                           - Prior to the procedure, a History and Physical                            was performed, and patient medications and                            allergies were reviewed. The patient's tolerance of                            previous anesthesia was also reviewed. The risks                            and benefits of the procedure and the sedation                            options and risks were discussed with the patient.                            All questions were answered, and informed consent                            was obtained. Prior Anticoagulants: The patient has                            taken no anticoagulant or antiplatelet agents. ASA                            Grade Assessment: III - A patient with severe                            systemic disease. After  reviewing the risks and                            benefits, the patient was deemed in satisfactory                            condition to undergo the procedure.                           After obtaining informed consent, the colonoscope                            was passed under direct vision. Throughout the                            procedure, the  patient's blood pressure, pulse, and                            oxygen saturations were monitored continuously. The                            CF-HQ190L ([id number]) Olympus colonoscope was                            introduced through the anus and advanced to the 3                            cm into the ileum. The colonoscopy was performed                            without difficulty. The patient tolerated the                            procedure. The quality of the bowel preparation was                            adequate. The terminal ileum, ileocecal valve,                            appendiceal orifice, and rectum were photographed. Scope In: 8:25:45 AM Scope Out: 8:42:36 AM Scope Withdrawal Time: 0 hours 13 minutes 43 seconds  Total Procedure Duration: 0 hours 16 minutes 51 seconds  Findings:      The digital rectal exam findings include hemorrhoids. Pertinent       negatives include no palpable rectal lesions.      A large amount of semi-liquid stool was found in the entire colon,       interfering with visualization. Lavage of the area was performed using       copious amounts, resulting in clearance with adequate visualization.      The terminal ileum and ileocecal valve appeared normal.      Normal mucosa was found in the entire colon otherwise.      Non-bleeding non-thrombosed internal hemorrhoids were found during       retroflexion, during perianal exam and during digital exam. The  hemorrhoids were Grade II (internal hemorrhoids that prolapse but reduce       spontaneously). Impression:               - Hemorrhoids found on digital rectal exam.                           - Stool in the entire examined colon.                           - The examined portion of the ileum was normal.                           - Normal mucosa in the entire examined colon                            otherwise.                           - Non-bleeding non-thrombosed internal  hemorrhoids. Moderate Sedation:      Not Applicable - Patient had care per Anesthesia. Recommendation:           - The patient will be observed post-procedure,                            until all discharge criteria are met.                           - Discharge patient to home.                           - Patient has a contact number available for                            emergencies. The signs and symptoms of potential                            delayed complications were discussed with the                            patient. Return to normal activities tomorrow.                            Written discharge instructions were provided to the                            patient.                           - High fiber diet.                           - Use FiberCon 1-2 tablets PO daily.                           - Continue present medications.                           -  Consider further medications for constipation as                            per patient desire                            (Linzess/Trulance/IBSrela/Amitiza).                           - Repeat colonoscopy in 10 years for screening                            purposes.                           - There had previously been concern for potential                            diverticulitis, but patient has now had updated                            cross-sectional imaging and this colonoscopy, and I                            cannot find large diverticulosis (small diverticula                            could have been missed but felt less likely to be                            the case).                           - The findings and recommendations were discussed                            with the patient.                           - The findings and recommendations were discussed                            with the patient's family. Procedure Code(s):        --- Professional ---                           Z6109, Colorectal  cancer screening; colonoscopy on                            individual not meeting criteria for high risk Diagnosis Code(s):        --- Professional ---                           Z12.11, Encounter for screening for malignant  neoplasm of colon                           K64.1, Second degree hemorrhoids                           K57.30, Diverticulosis of large intestine without                            perforation or abscess without bleeding CPT copyright 2022 American Medical Association. All rights reserved. The codes documented in this report are preliminary and upon coder review may  be revised to meet current compliance requirements. Corliss Parish, MD 12/16/2022 8:52:52 AM Number of Addenda: 0

## 2022-12-16 NOTE — Discharge Instructions (Signed)

## 2022-12-16 NOTE — Op Note (Signed)
[facility] Patient Name: Cameron [name] Procedure Date: 12/16/2022 MRN: [id number] Attending MD: Corliss Parish , MD, [id number] Date of Birth: Nov 18, 1965 CSN: [id number] Age: 57 Admit Type: Outpatient Procedure:                Upper GI endoscopy Indications:              Dysphagia, Abdominal bloating Providers:                Corliss Parish, MD, Norman Clay, RN, Priscella Mann, Technician Referring MD:              Medicines:                Monitored Anesthesia Care Complications:            No immediate complications. Estimated Blood Loss:     Estimated blood loss was minimal. Procedure:                Pre-Anesthesia Assessment:                           - Prior to the procedure, a History and Physical                            was performed, and patient medications and                            allergies were reviewed. The patient's tolerance of                            previous anesthesia was also reviewed. The risks                            and benefits of the procedure and the sedation                            options and risks were discussed with the patient.                            All questions were answered, and informed consent                            was obtained. Prior Anticoagulants: The patient has                            taken no anticoagulant or antiplatelet agents. ASA                            Grade Assessment: III - A patient with severe                            systemic disease. After reviewing the risks and  benefits, the patient was deemed in satisfactory                            condition to undergo the procedure.                           After obtaining informed consent, the endoscope was                            passed under direct vision. Throughout the                            procedure, the patient's blood pressure, pulse, and                             oxygen saturations were monitored continuously. The                            [id number]-[id number] ([id number]) Olympus endoscope was introduced                            through the mouth, and advanced to the second part                            of duodenum. The upper GI endoscopy was                            accomplished without difficulty. The patient                            tolerated the procedure. Scope In: Scope Out: Findings:      No gross lesions were noted in the entire esophagus. Biopsies were taken       with a cold forceps for histology to rule out EOE/LOE. After the rest of       the EGD was completed, a guidewire was placed and the scope was       withdrawn. Dilation was performed with a Savary dilator with mild       resistance at 18 mm. The dilation site was examined following endoscope       reinsertion and showed no change.      The Z-line was regular and was found 41 cm from the incisors.      Patchy mildly erythematous mucosa without bleeding was found in the       entire examined stomach. Biopsies were taken with a cold forceps for       histology and Helicobacter pylori testing.      No gross lesions were noted in the duodenal bulb, in the first portion       of the duodenum and in the second portion of the duodenum. Biopsies were       taken with a cold forceps for histology. Impression:               - No gross lesions in the entire esophagus.  Biopsied. Dilated to 18 mm Savary.                           - Z-line regular, 41 cm from the incisors.                           - Erythematous mucosa in the stomach. Biopsied.                           - No gross lesions in the duodenal bulb, in the                            first portion of the duodenum and in the second                            portion of the duodenum. Biopsied. Moderate Sedation:      Not Applicable - Patient had care per Anesthesia. Recommendation:           - Proceed to  scheduled colonoscopy.                           - Please use Cepacol or Halls Lozenges +/-                            Chloraseptic spray for next 72-96 hours to aid in                            sore thoat should you experience this.                           - Dilation diet as per protocol.                           - Monitor for signs/symptoms of bleeding,                            perforation, and infection. If issues please call                            our number to get further assistance as needed.                           - Observe patient's clinical course.                           - Continue present medications.                           - Await pathology results.                           - If issues of dysphagia persist, suspect this is                            more component  from previous cervical surgery, but                            could consider esophageal manometry.                           - If dysphagia symptoms improve after empiric                            dilation then consider repeat in future if                            longer-standing improvement.                           - The findings and recommendations were discussed                            with the patient.                           - The findings and recommendations were discussed                            with the patient's family. Procedure Code(s):        --- Professional ---                           [phone], Esophagogastroduodenoscopy, flexible,                            transoral; with insertion of guide wire followed by                            passage of dilator(s) through esophagus over guide                            wire                           43239, 59, Esophagogastroduodenoscopy, flexible,                            transoral; with biopsy, single or multiple Diagnosis Code(s):        --- Professional ---                           K31.89, Other diseases of stomach and  duodenum                           R13.10, Dysphagia, unspecified                           R14.0, Abdominal distension (gaseous) CPT copyright 2022 American Medical Association. All rights reserved. The codes documented in this report are preliminary and upon coder review may  be revised to meet current compliance requirements. Corliss Parish, MD 12/16/2022 8:23:50  AM Number of Addenda: 0

## 2022-12-16 NOTE — Transfer of Care (Signed)
Immediate Anesthesia Transfer of Care Note  Patient: Cameron [name]  Procedure(s) Performed: COLONOSCOPY WITH PROPOFOL ESOPHAGOGASTRODUODENOSCOPY (EGD) WITH PROPOFOL BIOPSY SAVORY DILATION  Patient Location: PACU  Anesthesia Type:MAC  Level of Consciousness: awake and alert   Airway & Oxygen Therapy: Patient Spontanous Breathing and Patient connected to nasal cannula oxygen  Post-op Assessment: Report given to RN and Post -op Vital signs reviewed and stable  Post vital signs: Reviewed and stable  Last Vitals:  Vitals Value Taken Time  BP 123/81 12/16/22 0850  Temp    Pulse 72 12/16/22 0851  Resp 15 12/16/22 0851  SpO2 98 % 12/16/22 0851  Vitals shown include unfiled device data.  Last Pain:  Vitals:   12/16/22 0645  TempSrc: Oral  PainSc: 0-No pain         Complications: No notable events documented.

## 2022-12-17 LAB — SURGICAL PATHOLOGY

## 2022-12-18 ENCOUNTER — Other Ambulatory Visit: Payer: Self-pay | Admitting: Neurosurgery

## 2022-12-18 ENCOUNTER — Encounter: Payer: Self-pay | Admitting: Gastroenterology

## 2022-12-18 ENCOUNTER — Encounter (HOSPITAL_COMMUNITY): Payer: Self-pay | Admitting: Gastroenterology

## 2022-12-18 DIAGNOSIS — M542 Cervicalgia: Secondary | ICD-10-CM

## 2022-12-26 ENCOUNTER — Ambulatory Visit
Admission: RE | Admit: 2022-12-26 | Discharge: 2022-12-26 | Disposition: A | Discharge status: Home / Self Care | Payer: No Typology Code available for payment source | Source: Ambulatory Visit | Attending: Neurosurgery | Admitting: Neurosurgery

## 2022-12-26 DIAGNOSIS — M542 Cervicalgia: Secondary | ICD-10-CM | POA: Diagnosis present

## 2023-01-22 ENCOUNTER — Other Ambulatory Visit: Payer: Self-pay | Admitting: Neurosurgery

## 2023-01-29 ENCOUNTER — Other Ambulatory Visit: Payer: Self-pay | Admitting: Neurosurgery

## 2023-02-20 ENCOUNTER — Other Ambulatory Visit: Payer: Self-pay | Admitting: Neurosurgery

## 2023-02-24 NOTE — Progress Notes (Signed)
 Surgical Instructions   Your procedure is scheduled on Wednesday, January 22nd, 2025. Report to [facility] Main Entrance "A" at 5:30 A.M., then check in with the Admitting office. Any questions or running late day of surgery: call [phone]  Questions prior to your surgery date: call [phone], Monday-Friday, 8am-4pm. If you experience any cold or flu symptoms such as cough, fever, chills, shortness of breath, etc. between now and your scheduled surgery, please notify us  at the above number.     Remember:  Do not eat or drink after midnight the night before your surgery    Take these medicines the morning of surgery with A SIP OF WATER: Fluticasone  (Flonase ) Levocetirizine (Xyzal ) Dulera  Inhaler Tiotripium Bromide Monohydrate inhaler Venlafaxine    May take these medicines IF NEEDED: Acetaminophen  (Tylenol ) Albuterol  Nebulizer Albuterol  Inhaler (please bring with you on day of surgery) Azelastine  (Astelin ) Nasal Spray Pataday eye drops Ondansetron  (Zofran )  Prednisone (Deltasone) Rizatriptan (Maxalt)    One week prior to surgery, STOP taking any Aspirin (unless otherwise instructed by your surgeon) Aleve, Naproxen, Ibuprofen, Motrin, Advil, Goody's, BC's, all herbal medications, fish oil, and non-prescription vitamins.   WHAT DO I DO ABOUT MY DIABETES MEDICATION?   Do not take oral diabetes medicines (pills) the morning of surgery.  Do not take Metformin  on the day of surgery.    HOW TO MANAGE YOUR DIABETES BEFORE AND AFTER SURGERY  Why is it important to control my blood sugar before and after surgery? Improving blood sugar levels before and after surgery helps healing and can limit problems. A way of improving blood sugar control is eating a healthy diet by:  Eating less sugar and carbohydrates  Increasing activity/exercise  Talking with your doctor about reaching your blood sugar goals High blood sugars (greater than 180 mg/dL) can raise your risk of  infections and slow your recovery, so you will need to focus on controlling your diabetes during the weeks before surgery. Make sure that the doctor who takes care of your diabetes knows about your planned surgery including the date and location.  How do I manage my blood sugar before surgery? Check your blood sugar at least 4 times a day, starting 2 days before surgery, to make sure that the level is not too high or low.  Check your blood sugar the morning of your surgery when you wake up and every 2 hours until you get to the Short Stay unit.  If your blood sugar is less than 70 mg/dL, you will need to treat for low blood sugar: Do not take insulin . Treat a low blood sugar (less than 70 mg/dL) with  cup of clear juice (cranberry or apple), 4 glucose tablets, OR glucose gel. Recheck blood sugar in 15 minutes after treatment (to make sure it is greater than 70 mg/dL). If your blood sugar is not greater than 70 mg/dL on recheck, call [phone] for further instructions. Report your blood sugar to the short stay nurse when you get to Short Stay.  If you are admitted to the hospital after surgery: Your blood sugar will be checked by the staff and you will probably be given insulin  after surgery (instead of oral diabetes medicines) to make sure you have good blood sugar levels. The goal for blood sugar control after surgery is 80-180 mg/dL.                      Do NOT Smoke (Tobacco/Vaping) for 24 hours prior to your procedure.  If  you use a CPAP at night, you may bring your mask/headgear for your overnight stay.   You will be asked to remove any contacts, glasses, piercing's, hearing aid's, dentures/partials prior to surgery. Please bring cases for these items if needed.    Patients discharged the day of surgery will not be allowed to drive home, and someone needs to stay with them for 24 hours.  SURGICAL WAITING ROOM VISITATION Patients may have no more than 2 support people in the  waiting area - these visitors may rotate.   Pre-op nurse will coordinate an appropriate time for 1 ADULT support person, who may not rotate, to accompany patient in pre-op.  Children under the age of 42 must have an adult with them who is not the patient and must remain in the main waiting area with an adult.  If the patient needs to stay at the hospital during part of their recovery, the visitor guidelines for inpatient rooms apply.  Please refer to the [facility] website for the visitor guidelines for any additional information.   If you received a COVID test during your pre-op visit  it is requested that you wear a mask when out in public, stay away from anyone that may not be feeling well and notify your surgeon if you develop symptoms. If you have been in contact with anyone that has tested positive in the last 10 days please notify you surgeon.      Pre-operative 5 CHG Bathing Instructions   You can play a key role in reducing the risk of infection after surgery. Your skin needs to be as free of germs as possible. You can reduce the number of germs on your skin by washing with CHG (chlorhexidine  gluconate) soap before surgery. CHG is an antiseptic soap that kills germs and continues to kill germs even after washing.   DO NOT use if you have an allergy to chlorhexidine /CHG or antibacterial soaps. If your skin becomes reddened or irritated, stop using the CHG and notify one of our RNs at [phone].   Please shower with the CHG soap starting 4 days before surgery using the following schedule:     Please keep in mind the following:  DO NOT shave, including legs and underarms, starting the day of your first shower.   You may shave your face at any point before/day of surgery.  Place clean sheets on your bed the day you start using CHG soap. Use a clean washcloth (not used since being washed) for each shower. DO NOT sleep with pets once you start using the CHG.   CHG Shower  Instructions:  Wash your face and private area with normal soap. If you choose to wash your hair, wash first with your normal shampoo.  After you use shampoo/soap, rinse your hair and body thoroughly to remove shampoo/soap residue.  Turn the water OFF and apply about 3 tablespoons (45 ml) of CHG soap to a CLEAN washcloth.  Apply CHG soap ONLY FROM YOUR NECK DOWN TO YOUR TOES (washing for 3-5 minutes)  DO NOT use CHG soap on face, private areas, open wounds, or sores.  Pay special attention to the area where your surgery is being performed.  If you are having back surgery, having someone wash your back for you may be helpful. Wait 2 minutes after CHG soap is applied, then you may rinse off the CHG soap.  Pat dry with a clean towel  Put on clean clothes/pajamas   If you choose to  wear lotion, please use ONLY the CHG-compatible lotions that are listed below.  Additional instructions for the day of surgery: DO NOT APPLY any lotions, deodorants, cologne, or perfumes.   Do not bring valuables to the hospital. [facility] is not responsible for any belongings/valuables. Do not wear nail polish, gel polish, artificial nails, or any other type of covering on natural nails (fingers and toes) Do not wear jewelry or makeup Put on clean/comfortable clothes.  Please brush your teeth.  Ask your nurse before applying any prescription medications to the skin.     CHG Compatible Lotions   Aveeno Moisturizing lotion  Cetaphil Moisturizing Cream  Cetaphil Moisturizing Lotion  Clairol Herbal Essence Moisturizing Lotion, Dry Skin  Clairol Herbal Essence Moisturizing Lotion, Extra Dry Skin  Clairol Herbal Essence Moisturizing Lotion, Normal Skin  Curel Age Defying Therapeutic Moisturizing Lotion with Alpha Hydroxy  Curel Extreme Care Body Lotion  Curel Soothing Hands Moisturizing Hand Lotion  Curel Therapeutic Moisturizing Cream, Fragrance-Free  Curel Therapeutic Moisturizing Lotion, Fragrance-Free   Curel Therapeutic Moisturizing Lotion, Original Formula  Eucerin Daily Replenishing Lotion  Eucerin Dry Skin Therapy Plus Alpha Hydroxy Crme  Eucerin Dry Skin Therapy Plus Alpha Hydroxy Lotion  Eucerin Original Crme  Eucerin Original Lotion  Eucerin Plus Crme Eucerin Plus Lotion  Eucerin TriLipid Replenishing Lotion  Keri Anti-Bacterial Hand Lotion  Keri Deep Conditioning Original Lotion Dry Skin Formula Softly Scented  Keri Deep Conditioning Original Lotion, Fragrance Free Sensitive Skin Formula  Keri Lotion Fast Absorbing Fragrance Free Sensitive Skin Formula  Keri Lotion Fast Absorbing Softly Scented Dry Skin Formula  Keri Original Lotion  Keri Skin Renewal Lotion Keri Silky Smooth Lotion  Keri Silky Smooth Sensitive Skin Lotion  Nivea Body Creamy Conditioning Oil  Nivea Body Extra Enriched Lotion  Nivea Body Original Lotion  Nivea Body Sheer Moisturizing Lotion Nivea Crme  Nivea Skin Firming Lotion  NutraDerm 30 Skin Lotion  NutraDerm Skin Lotion  NutraDerm Therapeutic Skin Cream  NutraDerm Therapeutic Skin Lotion  ProShield Protective Hand Cream  Provon moisturizing lotion  Please read over the following fact sheets that you were given.

## 2023-02-25 ENCOUNTER — Encounter (HOSPITAL_COMMUNITY): Payer: Self-pay

## 2023-02-25 ENCOUNTER — Other Ambulatory Visit: Payer: Self-pay

## 2023-02-25 ENCOUNTER — Encounter (HOSPITAL_COMMUNITY)
Admission: RE | Admit: 2023-02-25 | Discharge: 2023-02-25 | Discharge status: Home / Self Care | Payer: No Typology Code available for payment source | Source: Ambulatory Visit | Attending: Neurosurgery

## 2023-02-25 VITALS — BP 107/80 | HR 75 | Temp 98.4°F | Resp 18 | Ht 68.0 in | Wt 221.0 lb

## 2023-02-25 DIAGNOSIS — E119 Type 2 diabetes mellitus without complications: Secondary | ICD-10-CM | POA: Insufficient documentation

## 2023-02-25 DIAGNOSIS — Z01818 Encounter for other preprocedural examination: Secondary | ICD-10-CM | POA: Diagnosis present

## 2023-02-25 HISTORY — DX: Failed or difficult intubation, initial encounter: T88.4XXA

## 2023-02-25 LAB — CBC
HCT: 43.7 % (ref 39.0–52.0)
Hemoglobin: 14.2 g/dL (ref 13.0–17.0)
MCH: 29.8 pg (ref 26.0–34.0)
MCHC: 32.5 g/dL (ref 30.0–36.0)
MCV: 91.8 fL (ref 80.0–100.0)
Platelets: 183 10*3/uL (ref 150–400)
RBC: 4.76 MIL/uL (ref 4.22–5.81)
RDW: 11.6 % (ref 11.5–15.5)
WBC: 6.1 10*3/uL (ref 4.0–10.5)
nRBC: 0 % (ref 0.0–0.2)

## 2023-02-25 LAB — SURGICAL PCR SCREEN
MRSA, PCR: NEGATIVE
Staphylococcus aureus: NEGATIVE

## 2023-02-25 LAB — HEMOGLOBIN A1C
Hgb A1c MFr Bld: 6.3 % — ABNORMAL HIGH (ref 4.8–5.6)
Mean Plasma Glucose: 134.11 mg/dL

## 2023-02-25 LAB — BASIC METABOLIC PANEL
Anion gap: 7 (ref 5–15)
BUN: 16 mg/dL (ref 6–20)
CO2: 28 mmol/L (ref 22–32)
Calcium: 9 mg/dL (ref 8.9–10.3)
Chloride: 101 mmol/L (ref 98–111)
Creatinine, Ser: 0.88 mg/dL (ref 0.61–1.24)
GFR, Estimated: >60 mL/min (ref >60)
Glucose, Bld: 164 mg/dL — ABNORMAL HIGH (ref 70–99)
Potassium: 3.6 mmol/L (ref 3.5–5.1)
Sodium: 136 mmol/L (ref 135–145)

## 2023-02-25 LAB — GLUCOSE, CAPILLARY: Glucose-Capillary: 189 mg/dL — ABNORMAL HIGH (ref 70–99)

## 2023-02-25 LAB — TYPE AND SCREEN
ABO/RH(D): A POS
Antibody Screen: NEGATIVE

## 2023-02-25 NOTE — Progress Notes (Signed)
 PCP - Dr. Richardine Chancy - [facility] Cardiologist - [facility]   PPM/ICD - Denies Device Orders - n/a Rep Notified - n/a  Chest x-ray - n/a EKG - 02/25/23 Stress Test - 09/2012 ECHO - 2023 Cardiac Cath - Denies   Sleep Study - Yes CPAP - Yes, wears it daily, Settings are 11-16  Fasting Blood Sugar - 100-110 Checks Blood Sugar 1 time a week  Last dose of GLP1 agonist-  Denies GLP1 instructions: n/a  Blood Thinner Instructions: Denies Aspirin Instructions: Instructed not to take  ERAS Protcol - NPO  Anesthesia review: Y, pt states has small airway d/t prior anterior cervical fusion and had to have camera's to help intubate.  Hypertension, OSA w/cpap, DM.   Patient denies shortness of breath, fever, cough and chest pain at PAT appointment. Patient denies any respiratory issues at this time. Wears a mask in public at all times   All instructions explained to the patient, with a verbal understanding of the material. Patient agrees to go over the instructions while at home for a better understanding. Patient also instructed to self quarantine after being tested for COVID-19. The opportunity to ask questions was provided.

## 2023-02-26 NOTE — Anesthesia Preprocedure Evaluation (Addendum)
Anesthesia Evaluation  Patient identified by MRN, date of birth, ID band Patient awake    Reviewed: Allergy & Precautions, NPO status , Patient's Chart, lab work & pertinent test results  History of Anesthesia Complications (+) DIFFICULT AIRWAY and history of anesthetic complications  Airway Mallampati: II  TM Distance: >3 FB Neck ROM: Full    Dental  (+) Dental Advisory Given   Pulmonary asthma , sleep apnea    breath sounds clear to auscultation       Cardiovascular hypertension, Pt. on medications  Rhythm:Regular Rate:Normal     Neuro/Psych  Neuromuscular disease    GI/Hepatic negative GI ROS, Neg liver ROS,,,  Endo/Other  diabetes, Type 2    Renal/GU negative Renal ROS     Musculoskeletal  (+) Arthritis ,    Abdominal   Peds  Hematology negative hematology ROS (+)   Anesthesia Other Findings   Reproductive/Obstetrics                             Anesthesia Physical Anesthesia Plan  ASA: 3  Anesthesia Plan: General   Post-op Pain Management: Ofirmev IV (intra-op)*   Induction: Intravenous  PONV Risk Score and Plan: 2 and Dexamethasone, Ondansetron, Treatment may vary due to age or medical condition and Midazolam  Airway Management Planned: Video Laryngoscope Planned and Oral ETT  Additional Equipment:   Intra-op Plan:   Post-operative Plan: Extubation in OR  Informed Consent: I have reviewed the patients History and Physical, chart, labs and discussed the procedure including the risks, benefits and alternatives for the proposed anesthesia with the patient or authorized representative who has indicated his/her understanding and acceptance.     Dental advisory given  Plan Discussed with: CRNA  Anesthesia Plan Comments: (PAT note written 02/26/2023 by Shonna Chock, PA-C.  )       Anesthesia Quick Evaluation

## 2023-02-26 NOTE — Progress Notes (Addendum)
Anesthesia Chart Review:  Case: [id number] Date/Time: 03/04/23 0715   Procedure: C67 FORAMINOTOMY, C6-7 POSTERIOR INSTRUMENTATION AND FUSION (Left) - 3C   Anesthesia type: General   Pre-op diagnosis: PSEUDOARTHROSIS OF CERVICAL SPINE   [facility] ROOM 20 / [facility] OR   Surgeons: Tressie Stalker, MD       DISCUSSION: Patient is a 44 wear old male scheduled for the above procedure.  History includes never smoker, DIFFICULT AIRWAY ("narrow" due to cervical fusion, required Glidescope previously), HTN, DM2, asthma, nephrolithiasis, OSA (uses CPAP), skin cancer, anxiety, spinal surgery (ACDF C5-7 12/19/11; removal of C5-7 plate and redo ACDF C6-7 01/27/22), osteoarthritis (left THA 2020).   Per 01/27/22 Intubation Record: Induction Type: IV induction Ventilation: Mask ventilation without difficulty Laryngoscope Size: Glidescope and 4 Grade View: Grade I Tube type: Oral Tube size: 7.5 mm Number of attempts: 1 Airway Equipment and Method: Stylet and Video-laryngoscopy Dental Injury: Teeth and Oropharynx as per pre-operative assessment   Asthma and allergic rhinitis are managed by Dr. Lottie Rater with [facility]. Meds include albuterol as needed, Astelin nasal spray, Flonase nasal spray, Xyzal, Dulera, Singulair, Pataday, prednisone as needed, tiotropium bromide.   A1c 6.3%. On metformin.   Anesthesia team to evaluate on the day of surgery.    VS: BP 107/80   Pulse 75   Temp 36.9 C (Oral)   Resp 18   Ht 5\' 8"  (1.727 m)   Wt 100.2 kg   SpO2 98%   BMI 33.60 kg/m    PROVIDERS: Theron Arista, DO at [facility]. He has had a previous [facility] cardiology evaluation ~ 2023 for hypotension. Radojicic, Cristine, MD is allergist ([facility]) - He is not routinely followed by cardiology but had a [facility] evaluation in 10/2021 after a syncopal episode upon standing in 07/2021. BP was low and losartan discontinued. He had a normal echo and 14 day monitor showed SR, 4 SVT events up to 9 beats,  triggered events corresponded with PAC, PVC, or ST, and 13 were unassociated. Last virtual visit on 03/20/22 with Randa Ngo, PA also mentioned known vertigo history and "chronic" chest pain unchanged for over 20 years with prior negative stress tests, last in 2014. He recommended hydration, stand slowly, continue to hold losartan. As needed cardiology follow-up.    LABS: Labs reviewed: Acceptable for surgery. (all labs ordered are listed, but only abnormal results are displayed)  Labs Reviewed  GLUCOSE, CAPILLARY - Abnormal; Notable for the following components:      Result Value   Glucose-Capillary 189 (*)    All other components within normal limits  HEMOGLOBIN A1C - Abnormal; Notable for the following components:   Hgb A1c MFr Bld 6.3 (*)    All other components within normal limits  BASIC METABOLIC PANEL - Abnormal; Notable for the following components:   Glucose, Bld 164 (*)    All other components within normal limits  SURGICAL PCR SCREEN  CBC  TYPE AND SCREEN     IMAGES: CT C-spine 02/24/22: IMPRESSION: 1. Chronic C5-C6 fusion with solid arthrodesis. C6-C7 ACDF revision with improved interbody calcification, but no obvious bridging bone at this time. Continued CT surveillance may be valuable. 2. Stable cervical spine degeneration elsewhere since last year. No acute osseous abnormality in the cervical spine.    EKG: 02/25/23: Normal sinus rhythm Septal infarct , age undetermined (more likely V1-V2 lead misplacement) Abnormal ECG When compared with ECG of 13-Jul-2021 07:34, No significant change since last tracing Confirmed by Croitoru, Mihai [phone]) on 02/25/2023  6:37:12 PM   CV: 2023 [facility] Cardiac Testing as outlined by Randa Ngo, PA at 03/20/22 virtual follow-up visit:  Echo 2023 [facility]; has encounter encounter date of 12/19/21):   Interpretation Summary Normal transthoracic echocardiogram. There are no prior studies for  comparison. Left ventricular systolic  function is normal. Ejection Fraction = >55%. Normal diastolic function Normal transthoracic echocardiogram. There are no prior studies for comparison.  14 Day Monitor 11/2021: NSR, 4 SVT events, fastest  3 beat 156, longest 9 beat 110, 22 triggered events, one  correspond with pac and one with pvc, 7 with sinus tach and 13  unassociated    Nuclear stress test 10/07/12 ([facility]): NTERPRETATION:    PERFUSION: There is normal distribution of activity throughout the left ventricular myocardium both at rest and following stress.   WALL MOTION AND THICKENING : Normal wall motion and thickening is seen throughout the left ventricular myocardium.   RESTING LEFT VENTRICULAR EJECTION FRACTION:   65%    Past Medical History:  Diagnosis Date   Anxiety    Arthritis    generalized   Asthma    Cancer (HCC)    skin   Depression    Diabetes mellitus without complication (HCC)    type 2   Difficult intubation    has narrow airway d/t fusion and has needed camera in past procedures   History of kidney stones    Hypertension    Pneumonia    Sleep apnea     Past Surgical History:  Procedure Laterality Date   ANTERIOR CERVICAL DECOMP/DISCECTOMY FUSION N/A 01/27/2022   Procedure: ACDF,IP,PLATE/SCREWS E83;TDVVOHY FUSION;REM CERV HARDWARE;  Surgeon: Tressie Stalker, MD;  Location: [facility] OR;  Service: Neurosurgery;  Laterality: N/A;  3C   BACK SURGERY     neck 2011 and 2013   BIOPSY  12/16/2022   Procedure: BIOPSY;  Surgeon: Meridee Score Netty Starring., MD;  Location: [facility] ENDOSCOPY;  Service: Gastroenterology;;   CARPAL TUNNEL RELEASE Right    COLONOSCOPY WITH PROPOFOL N/A 12/16/2022   Procedure: COLONOSCOPY WITH PROPOFOL;  Surgeon: Lemar Lofty., MD;  Location: [facility];  Service: Gastroenterology;  Laterality: N/A;   ESOPHAGOGASTRODUODENOSCOPY (EGD) WITH PROPOFOL N/A 12/16/2022   Procedure: ESOPHAGOGASTRODUODENOSCOPY (EGD) WITH PROPOFOL;  Surgeon: Meridee Score Netty Starring., MD;   Location: [facility] ENDOSCOPY;  Service: Gastroenterology;  Laterality: N/A;   HERNIA REPAIR     left inguinal hernia repair as a child   JOINT REPLACEMENT Left    total joint replacement   KNEE ARTHROSCOPY Left    NASAL SEPTUM SURGERY  1986   REPAIR / REINSERT BICEPS TENDON AT ELBOW Right 2023   ROTATOR CUFF REPAIR Left    and bicep repair   SAVORY DILATION N/A 12/16/2022   Procedure: SAVORY DILATION;  Surgeon: Lemar Lofty., MD;  Location: [facility] ENDOSCOPY;  Service: Gastroenterology;  Laterality: N/A;   TENDON REPAIR Right    elbow   TOTAL HIP ARTHROPLASTY Left     MEDICATIONS:  acetaminophen (TYLENOL) 500 MG tablet   albuterol (ACCUNEB) 0.63 MG/3ML nebulizer solution   albuterol (VENTOLIN HFA) 108 (90 Base) MCG/ACT inhaler   atorvastatin (LIPITOR) 20 MG tablet   azelastine (ASTELIN) 0.1 % nasal spray   diclofenac Sodium (VOLTAREN) 1 % GEL   docusate sodium (COLACE) 100 MG capsule   fluticasone (FLONASE) 50 MCG/ACT nasal spray   levocetirizine (XYZAL) 5 MG tablet   metFORMIN (GLUCOPHAGE) 1000 MG tablet   mometasone-formoterol (DULERA) 100-5 MCG/ACT AERO   montelukast (SINGULAIR) 10 MG tablet  Olopatadine HCl (PATADAY) 0.2 % SOLN   ondansetron (ZOFRAN-ODT) 4 MG disintegrating tablet   predniSONE (DELTASONE) 20 MG tablet   rizatriptan (MAXALT) 10 MG tablet   Tiotropium Bromide Monohydrate 1.25 MCG/ACT AERS   traZODone (DESYREL) 50 MG tablet   triamcinolone ointment (KENALOG) 0.1 %   Venlafaxine HCl 225 MG TB24   Wheat Dextrin (BENEFIBER DRINK MIX PO)   No current facility-administered medications for this encounter.    Shonna Chock, PA-C Surgical Short Stay/Anesthesiology [facility] Phone [phone] [facility] Phone [phone] 02/26/2023 5:23 PM

## 2023-03-04 ENCOUNTER — Other Ambulatory Visit: Payer: Self-pay

## 2023-03-04 ENCOUNTER — Inpatient Hospital Stay (HOSPITAL_COMMUNITY): Payer: No Typology Code available for payment source | Admitting: Anesthesiology

## 2023-03-04 ENCOUNTER — Inpatient Hospital Stay (HOSPITAL_COMMUNITY): Payer: No Typology Code available for payment source

## 2023-03-04 ENCOUNTER — Encounter (HOSPITAL_COMMUNITY)
Admission: RE | Disposition: A | Discharge status: Home / Self Care | Payer: Self-pay | Source: Ambulatory Visit | Attending: Neurosurgery

## 2023-03-04 ENCOUNTER — Encounter (HOSPITAL_COMMUNITY): Payer: Self-pay | Admitting: Neurosurgery

## 2023-03-04 ENCOUNTER — Inpatient Hospital Stay (HOSPITAL_COMMUNITY): Payer: No Typology Code available for payment source | Admitting: Vascular Surgery

## 2023-03-04 ENCOUNTER — Inpatient Hospital Stay (HOSPITAL_COMMUNITY)
Admission: RE | Admit: 2023-03-04 | Discharge: 2023-03-05 | DRG: 473 | Disposition: A | Discharge status: Home / Self Care | Payer: No Typology Code available for payment source | Source: Ambulatory Visit | Attending: Neurosurgery | Admitting: Neurosurgery

## 2023-03-04 DIAGNOSIS — Y848 Other medical procedures as the cause of abnormal reaction of the patient, or of later complication, without mention of misadventure at the time of the procedure: Secondary | ICD-10-CM | POA: Diagnosis present

## 2023-03-04 DIAGNOSIS — M960 Pseudarthrosis after fusion or arthrodesis: Secondary | ICD-10-CM | POA: Diagnosis present

## 2023-03-04 DIAGNOSIS — M5412 Radiculopathy, cervical region: Secondary | ICD-10-CM | POA: Diagnosis present

## 2023-03-04 DIAGNOSIS — Z7984 Long term (current) use of oral hypoglycemic drugs: Secondary | ICD-10-CM

## 2023-03-04 DIAGNOSIS — J45909 Unspecified asthma, uncomplicated: Secondary | ICD-10-CM | POA: Diagnosis present

## 2023-03-04 DIAGNOSIS — Z79899 Other long term (current) drug therapy: Secondary | ICD-10-CM | POA: Diagnosis not present

## 2023-03-04 DIAGNOSIS — E119 Type 2 diabetes mellitus without complications: Secondary | ICD-10-CM

## 2023-03-04 DIAGNOSIS — I10 Essential (primary) hypertension: Secondary | ICD-10-CM | POA: Diagnosis present

## 2023-03-04 DIAGNOSIS — Z85828 Personal history of other malignant neoplasm of skin: Secondary | ICD-10-CM

## 2023-03-04 DIAGNOSIS — Z96642 Presence of left artificial hip joint: Secondary | ICD-10-CM | POA: Diagnosis present

## 2023-03-04 DIAGNOSIS — Z881 Allergy status to other antibiotic agents status: Secondary | ICD-10-CM

## 2023-03-04 DIAGNOSIS — Z885 Allergy status to narcotic agent status: Secondary | ICD-10-CM

## 2023-03-04 DIAGNOSIS — Z880 Allergy status to penicillin: Secondary | ICD-10-CM

## 2023-03-04 DIAGNOSIS — Z7951 Long term (current) use of inhaled steroids: Secondary | ICD-10-CM

## 2023-03-04 DIAGNOSIS — Z886 Allergy status to analgesic agent status: Secondary | ICD-10-CM | POA: Diagnosis not present

## 2023-03-04 DIAGNOSIS — M542 Cervicalgia: Secondary | ICD-10-CM | POA: Diagnosis present

## 2023-03-04 DIAGNOSIS — Z888 Allergy status to other drugs, medicaments and biological substances status: Secondary | ICD-10-CM

## 2023-03-04 DIAGNOSIS — Z91018 Allergy to other foods: Secondary | ICD-10-CM

## 2023-03-04 DIAGNOSIS — Z91040 Latex allergy status: Secondary | ICD-10-CM | POA: Diagnosis not present

## 2023-03-04 DIAGNOSIS — S129XXA Fracture of neck, unspecified, initial encounter: Principal | ICD-10-CM | POA: Diagnosis present

## 2023-03-04 DIAGNOSIS — M4802 Spinal stenosis, cervical region: Secondary | ICD-10-CM

## 2023-03-04 DIAGNOSIS — Z01818 Encounter for other preprocedural examination: Secondary | ICD-10-CM

## 2023-03-04 HISTORY — PX: POSTERIOR CERVICAL FUSION/FORAMINOTOMY: SHX5038

## 2023-03-04 LAB — GLUCOSE, CAPILLARY
Glucose-Capillary: 124 mg/dL — ABNORMAL HIGH (ref 70–99)
Glucose-Capillary: 151 mg/dL — ABNORMAL HIGH (ref 70–99)
Glucose-Capillary: 203 mg/dL — ABNORMAL HIGH (ref 70–99)
Glucose-Capillary: 141 mg/dL — ABNORMAL HIGH (ref 70–99)

## 2023-03-04 SURGERY — POSTERIOR CERVICAL FUSION/FORAMINOTOMY LEVEL 1
Anesthesia: General | Laterality: Left

## 2023-03-04 MED ORDER — LORATADINE 10 MG PO TABS
5.0000 mg | ORAL_TABLET | Freq: Two times a day (BID) | ORAL | Status: DC
  Administered 2023-03-04: 5 mg via ORAL
  Filled 2023-03-04: qty 1

## 2023-03-04 MED ORDER — CIPROFLOXACIN IN D5W 400 MG/200ML IV SOLN
400.0000 mg | Freq: Two times a day (BID) | INTRAVENOUS | Status: DC
  Administered 2023-03-04: 400 mg via INTRAVENOUS
  Filled 2023-03-04: qty 200

## 2023-03-04 MED ORDER — HYDROMORPHONE HCL 1 MG/ML IJ SOLN
INTRAMUSCULAR | Status: AC
  Filled 2023-03-04: qty 1

## 2023-03-04 MED ORDER — FENTANYL CITRATE (PF) 100 MCG/2ML IJ SOLN
25.0000 ug | INTRAMUSCULAR | Status: DC | PRN
  Administered 2023-03-04 (×3): 50 ug via INTRAVENOUS

## 2023-03-04 MED ORDER — DEXAMETHASONE SODIUM PHOSPHATE 10 MG/ML IJ SOLN
INTRAMUSCULAR | Status: AC
  Filled 2023-03-04: qty 1

## 2023-03-04 MED ORDER — MORPHINE SULFATE (PF) 2 MG/ML IV SOLN: 2.0000 mg | INTRAVENOUS | Status: DC | PRN

## 2023-03-04 MED ORDER — EPHEDRINE SULFATE-NACL 50-0.9 MG/10ML-% IV SOSY
PREFILLED_SYRINGE | INTRAVENOUS | Status: DC | PRN
  Administered 2023-03-04 (×2): 5 mg via INTRAVENOUS
  Administered 2023-03-04: 10 mg via INTRAVENOUS
  Administered 2023-03-04: 5 mg via INTRAVENOUS

## 2023-03-04 MED ORDER — FENTANYL CITRATE (PF) 250 MCG/5ML IJ SOLN
INTRAMUSCULAR | Status: AC
  Filled 2023-03-04: qty 5

## 2023-03-04 MED ORDER — ONDANSETRON HCL 4 MG PO TABS: 4.0000 mg | ORAL_TABLET | Freq: Four times a day (QID) | ORAL | Status: DC | PRN

## 2023-03-04 MED ORDER — LIDOCAINE 2% (20 MG/ML) 5 ML SYRINGE
INTRAMUSCULAR | Status: DC | PRN
  Administered 2023-03-04: 100 mg via INTRAVENOUS

## 2023-03-04 MED ORDER — BUPIVACAINE LIPOSOME 1.3 % IJ SUSP
INTRAMUSCULAR | Status: AC
Start: 2023-03-04 — End: ?
  Filled 2023-03-04: qty 20

## 2023-03-04 MED ORDER — ONDANSETRON HCL 4 MG/2ML IJ SOLN: 4.0000 mg | Freq: Four times a day (QID) | INTRAMUSCULAR | Status: DC | PRN

## 2023-03-04 MED ORDER — PROPOFOL 10 MG/ML IV BOLUS
INTRAVENOUS | Status: DC | PRN
  Administered 2023-03-04: 200 mg via INTRAVENOUS
  Administered 2023-03-04: 50 mg via INTRAVENOUS

## 2023-03-04 MED ORDER — BUPIVACAINE-EPINEPHRINE 0.5% -1:200000 IJ SOLN
INTRAMUSCULAR | Status: DC | PRN
  Administered 2023-03-04: 10 mL

## 2023-03-04 MED ORDER — ONDANSETRON HCL 4 MG/2ML IJ SOLN
INTRAMUSCULAR | Status: AC
  Filled 2023-03-04: qty 2

## 2023-03-04 MED ORDER — ATORVASTATIN CALCIUM 10 MG PO TABS
20.0000 mg | ORAL_TABLET | Freq: Every day | ORAL | Status: DC
Start: 2023-03-04 — End: 2023-03-05
  Administered 2023-03-04: 20 mg via ORAL
  Filled 2023-03-04: qty 2

## 2023-03-04 MED ORDER — MIDAZOLAM HCL 2 MG/2ML IJ SOLN
INTRAMUSCULAR | Status: AC
  Filled 2023-03-04: qty 2

## 2023-03-04 MED ORDER — MONTELUKAST SODIUM 10 MG PO TABS
10.0000 mg | ORAL_TABLET | Freq: Every day | ORAL | Status: DC
Start: 2023-03-04 — End: 2023-03-05
  Administered 2023-03-04: 10 mg via ORAL
  Filled 2023-03-04: qty 1

## 2023-03-04 MED ORDER — MOMETASONE FURO-FORMOTEROL FUM 100-5 MCG/ACT IN AERO
2.0000 | INHALATION_SPRAY | Freq: Two times a day (BID) | RESPIRATORY_TRACT | Status: DC
  Administered 2023-03-04 – 2023-03-05 (×2): 2 via RESPIRATORY_TRACT
  Filled 2023-03-04: qty 8.8

## 2023-03-04 MED ORDER — DEXAMETHASONE SODIUM PHOSPHATE 4 MG/ML IJ SOLN: 4.0000 mg | Freq: Four times a day (QID) | INTRAMUSCULAR | Status: AC

## 2023-03-04 MED ORDER — FENTANYL CITRATE (PF) 100 MCG/2ML IJ SOLN
INTRAMUSCULAR | Status: AC
  Filled 2023-03-04: qty 2

## 2023-03-04 MED ORDER — ALBUTEROL SULFATE HFA 108 (90 BASE) MCG/ACT IN AERS: 2.0000 | INHALATION_SPRAY | Freq: Four times a day (QID) | RESPIRATORY_TRACT | Status: DC | PRN

## 2023-03-04 MED ORDER — BUPIVACAINE LIPOSOME 1.3 % IJ SUSP
INTRAMUSCULAR | Status: DC | PRN
  Administered 2023-03-04: 20 mL

## 2023-03-04 MED ORDER — TIOTROPIUM BROMIDE MONOHYDRATE 1.25 MCG/ACT IN AERS: 1.2500 ug | INHALATION_SPRAY | Freq: Every day | RESPIRATORY_TRACT | Status: DC

## 2023-03-04 MED ORDER — LACTATED RINGERS IV SOLN: INTRAVENOUS | Status: DC | PRN

## 2023-03-04 MED ORDER — MIDAZOLAM HCL 2 MG/2ML IJ SOLN
INTRAMUSCULAR | Status: DC | PRN
  Administered 2023-03-04: 2 mg via INTRAVENOUS

## 2023-03-04 MED ORDER — ROCURONIUM BROMIDE 10 MG/ML (PF) SYRINGE
PREFILLED_SYRINGE | INTRAVENOUS | Status: DC | PRN
  Administered 2023-03-04: 70 mg via INTRAVENOUS
  Administered 2023-03-04: 30 mg via INTRAVENOUS

## 2023-03-04 MED ORDER — CEFAZOLIN SODIUM-DEXTROSE 2-4 GM/100ML-% IV SOLN
2.0000 g | Freq: Three times a day (TID) | INTRAVENOUS | Status: AC
Start: 2023-03-04 — End: 2023-03-05
  Administered 2023-03-04 (×2): 2 g via INTRAVENOUS
  Filled 2023-03-04 (×2): qty 100

## 2023-03-04 MED ORDER — DOCUSATE SODIUM 100 MG PO CAPS
100.0000 mg | ORAL_CAPSULE | Freq: Two times a day (BID) | ORAL | Status: DC
  Administered 2023-03-04: 100 mg via ORAL
  Filled 2023-03-04: qty 1

## 2023-03-04 MED ORDER — AZELASTINE HCL 0.1 % NA SOLN: 1.0000 | Freq: Every day | NASAL | Status: DC | PRN

## 2023-03-04 MED ORDER — PANTOPRAZOLE SODIUM 40 MG PO TBEC
40.0000 mg | DELAYED_RELEASE_TABLET | Freq: Every day | ORAL | Status: DC
  Administered 2023-03-04: 40 mg via ORAL
  Filled 2023-03-04: qty 1

## 2023-03-04 MED ORDER — SODIUM CHLORIDE 0.9% FLUSH
3.0000 mL | INTRAVENOUS | Status: DC | PRN
Start: 2023-03-04 — End: 2023-03-04

## 2023-03-04 MED ORDER — BUPIVACAINE-EPINEPHRINE (PF) 0.5% -1:200000 IJ SOLN
INTRAMUSCULAR | Status: AC
  Filled 2023-03-04: qty 30

## 2023-03-04 MED ORDER — HYDROMORPHONE HCL 2 MG PO TABS
2.0000 mg | ORAL_TABLET | ORAL | Status: DC | PRN
  Administered 2023-03-04 – 2023-03-05 (×5): 4 mg via ORAL
  Filled 2023-03-04 (×5): qty 2

## 2023-03-04 MED ORDER — SUGAMMADEX SODIUM 200 MG/2ML IV SOLN
INTRAVENOUS | Status: DC | PRN
  Administered 2023-03-04: 200 mg via INTRAVENOUS

## 2023-03-04 MED ORDER — CHLORHEXIDINE GLUCONATE 0.12 % MT SOLN
15.0000 mL | Freq: Once | OROMUCOSAL | Status: AC
  Administered 2023-03-04: 15 mL via OROMUCOSAL
  Filled 2023-03-04: qty 15

## 2023-03-04 MED ORDER — PANTOPRAZOLE SODIUM 40 MG IV SOLR: 40.0000 mg | Freq: Every day | INTRAVENOUS | Status: DC

## 2023-03-04 MED ORDER — FENTANYL CITRATE (PF) 250 MCG/5ML IJ SOLN
INTRAMUSCULAR | Status: DC | PRN
  Administered 2023-03-04 (×3): 50 ug via INTRAVENOUS

## 2023-03-04 MED ORDER — MENTHOL 3 MG MT LOZG
1.0000 | LOZENGE | OROMUCOSAL | Status: DC | PRN
Start: 2023-03-04 — End: 2023-03-05

## 2023-03-04 MED ORDER — VENLAFAXINE HCL ER 75 MG PO CP24
225.0000 mg | ORAL_CAPSULE | Freq: Every day | ORAL | Status: DC
  Administered 2023-03-05: 225 mg via ORAL
  Filled 2023-03-04: qty 3

## 2023-03-04 MED ORDER — THROMBIN 5000 UNITS EX SOLR
CUTANEOUS | Status: AC
  Filled 2023-03-04: qty 5000

## 2023-03-04 MED ORDER — BACITRACIN 500 UNIT/GM EX OINT
TOPICAL_OINTMENT | CUTANEOUS | Status: DC | PRN
  Administered 2023-03-04 (×2): 1 via TOPICAL

## 2023-03-04 MED ORDER — BACITRACIN ZINC 500 UNIT/GM EX OINT
TOPICAL_OINTMENT | CUTANEOUS | Status: AC
  Filled 2023-03-04: qty 28.35

## 2023-03-04 MED ORDER — SUMATRIPTAN SUCCINATE 100 MG PO TABS: 100.0000 mg | ORAL_TABLET | ORAL | Status: DC | PRN

## 2023-03-04 MED ORDER — PHENOL 1.4 % MT LIQD: 1.0000 | OROMUCOSAL | Status: DC | PRN

## 2023-03-04 MED ORDER — BISACODYL 10 MG RE SUPP: 10.0000 mg | Freq: Every day | RECTAL | Status: DC | PRN

## 2023-03-04 MED ORDER — ACETAMINOPHEN 500 MG PO TABS
1000.0000 mg | ORAL_TABLET | Freq: Four times a day (QID) | ORAL | Status: AC
  Administered 2023-03-04 – 2023-03-05 (×4): 1000 mg via ORAL
  Filled 2023-03-04 (×4): qty 2

## 2023-03-04 MED ORDER — ORAL CARE MOUTH RINSE: 15.0000 mL | Freq: Once | OROMUCOSAL | Status: AC

## 2023-03-04 MED ORDER — LIDOCAINE 2% (20 MG/ML) 5 ML SYRINGE
INTRAMUSCULAR | Status: AC
  Filled 2023-03-04: qty 5

## 2023-03-04 MED ORDER — CHLORHEXIDINE GLUCONATE CLOTH 2 % EX PADS: 6.0000 | MEDICATED_PAD | Freq: Once | CUTANEOUS | Status: DC

## 2023-03-04 MED ORDER — AMISULPRIDE (ANTIEMETIC) 5 MG/2ML IV SOLN: 10.0000 mg | Freq: Once | INTRAVENOUS | Status: DC | PRN

## 2023-03-04 MED ORDER — METFORMIN HCL 500 MG PO TABS
1000.0000 mg | ORAL_TABLET | Freq: Every day | ORAL | Status: DC
Start: 2023-03-04 — End: 2023-03-05
  Administered 2023-03-04: 1000 mg via ORAL
  Filled 2023-03-04: qty 2

## 2023-03-04 MED ORDER — FLUTICASONE PROPIONATE 50 MCG/ACT NA SUSP
2.0000 | Freq: Every day | NASAL | Status: DC
Start: 2023-03-04 — End: 2023-03-05
  Filled 2023-03-04: qty 16

## 2023-03-04 MED ORDER — PHENYLEPHRINE HCL-NACL 20-0.9 MG/250ML-% IV SOLN
INTRAVENOUS | Status: DC | PRN
  Administered 2023-03-04: 20 ug/min via INTRAVENOUS

## 2023-03-04 MED ORDER — UMECLIDINIUM BROMIDE 62.5 MCG/ACT IN AEPB
1.0000 | INHALATION_SPRAY | Freq: Every day | RESPIRATORY_TRACT | Status: DC
  Administered 2023-03-05: 1 via RESPIRATORY_TRACT
  Filled 2023-03-04: qty 7

## 2023-03-04 MED ORDER — SODIUM CHLORIDE 0.9% FLUSH: 3.0000 mL | Freq: Two times a day (BID) | INTRAVENOUS | Status: DC

## 2023-03-04 MED ORDER — ONDANSETRON HCL 4 MG/2ML IJ SOLN
INTRAMUSCULAR | Status: DC | PRN
  Administered 2023-03-04: 4 mg via INTRAVENOUS

## 2023-03-04 MED ORDER — HYDROMORPHONE HCL 1 MG/ML IJ SOLN
0.2500 mg | INTRAMUSCULAR | Status: DC | PRN
  Administered 2023-03-04 (×2): 0.5 mg via INTRAVENOUS

## 2023-03-04 MED ORDER — CYCLOBENZAPRINE HCL 10 MG PO TABS
10.0000 mg | ORAL_TABLET | Freq: Three times a day (TID) | ORAL | Status: DC | PRN
  Administered 2023-03-04 – 2023-03-05 (×3): 10 mg via ORAL
  Filled 2023-03-04 (×3): qty 1

## 2023-03-04 MED ORDER — PROPOFOL 10 MG/ML IV BOLUS
INTRAVENOUS | Status: AC
  Filled 2023-03-04: qty 20

## 2023-03-04 MED ORDER — LACTATED RINGERS IV SOLN: INTRAVENOUS | Status: AC

## 2023-03-04 MED ORDER — DEXAMETHASONE SODIUM PHOSPHATE 10 MG/ML IJ SOLN
INTRAMUSCULAR | Status: DC | PRN
  Administered 2023-03-04: 10 mg via INTRAVENOUS

## 2023-03-04 MED ORDER — DEXAMETHASONE 4 MG PO TABS
4.0000 mg | ORAL_TABLET | Freq: Four times a day (QID) | ORAL | Status: AC
  Administered 2023-03-04 (×2): 4 mg via ORAL
  Filled 2023-03-04 (×2): qty 1

## 2023-03-04 MED ORDER — ROCURONIUM BROMIDE 10 MG/ML (PF) SYRINGE
PREFILLED_SYRINGE | INTRAVENOUS | Status: AC
  Filled 2023-03-04: qty 10

## 2023-03-04 MED ORDER — 0.9 % SODIUM CHLORIDE (POUR BTL) OPTIME
TOPICAL | Status: DC | PRN
  Administered 2023-03-04: 1000 mL

## 2023-03-04 MED ORDER — PHENYLEPHRINE 80 MCG/ML (10ML) SYRINGE FOR IV PUSH (FOR BLOOD PRESSURE SUPPORT)
PREFILLED_SYRINGE | INTRAVENOUS | Status: DC | PRN
  Administered 2023-03-04 (×2): 160 ug via INTRAVENOUS

## 2023-03-04 MED ORDER — ALUM & MAG HYDROXIDE-SIMETH 200-200-20 MG/5ML PO SUSP: 30.0000 mL | Freq: Four times a day (QID) | ORAL | Status: DC | PRN

## 2023-03-04 MED ORDER — THROMBIN 5000 UNITS EX SOLR
OROMUCOSAL | Status: DC | PRN
  Administered 2023-03-04: 5 mL via TOPICAL

## 2023-03-04 SURGICAL SUPPLY — 57 items
BAG COUNTER SPONGE SURGICOUNT (BAG) ×1 IMPLANT
BENZOIN TINCTURE PRP APPL 2/3 (GAUZE/BANDAGES/DRESSINGS) IMPLANT
BIT DRILL 14MM FIXED VIRAGE (DRILL) IMPLANT
BIT DRILL NEURO 2X3.1 SFT TUCH (MISCELLANEOUS) ×1 IMPLANT
BLADE CLIPPER SURG (BLADE) IMPLANT
BLADE ULTRA TIP 2M (BLADE) IMPLANT
BUR MATCHSTICK NEURO 3.0 LAGG (BURR) IMPLANT
CANISTER SUCT 3000ML PPV (MISCELLANEOUS) ×1 IMPLANT
CAP CLSR POST CERV (Cap) IMPLANT
DRAPE C-ARM 42X72 X-RAY (DRAPES) ×2 IMPLANT
DRAPE LAPAROTOMY 100X72 PEDS (DRAPES) ×1 IMPLANT
DRAPE MICROSCOPE SLANT 54X150 (MISCELLANEOUS) IMPLANT
DRAPE SURG 17X23 STRL (DRAPES) ×3 IMPLANT
DRILL 14MM FIXED VIRAGE (DRILL) ×1
DRILL NEURO 2X3.1 SOFT TOUCH (MISCELLANEOUS) ×1
DRSG OPSITE POSTOP 4X6 (GAUZE/BANDAGES/DRESSINGS) IMPLANT
ELECT REM PT RETURN 9FT ADLT (ELECTROSURGICAL) ×1
ELECTRODE REM PT RTRN 9FT ADLT (ELECTROSURGICAL) ×1 IMPLANT
GAUZE 4X4 16PLY ~~LOC~~+RFID DBL (SPONGE) IMPLANT
GAUZE SPONGE 4X4 12PLY STRL (GAUZE/BANDAGES/DRESSINGS) IMPLANT
GLOVE BIO SURGEON STRL SZ8 (GLOVE) ×1 IMPLANT
GLOVE BIO SURGEON STRL SZ8.5 (GLOVE) ×1 IMPLANT
GLOVE EXAM NITRILE XL STR (GLOVE) IMPLANT
GOWN STRL REUS W/ TWL LRG LVL3 (GOWN DISPOSABLE) IMPLANT
GOWN STRL REUS W/ TWL XL LVL3 (GOWN DISPOSABLE) ×1 IMPLANT
GOWN STRL REUS W/TWL 2XL LVL3 (GOWN DISPOSABLE) ×1 IMPLANT
HEMOSTAT POWDER KIT SURGIFOAM (HEMOSTASIS) ×1 IMPLANT
KIT BASIN OR (CUSTOM PROCEDURE TRAY) ×1 IMPLANT
KIT TURNOVER KIT B (KITS) ×1 IMPLANT
NDL HYPO 21X1.5 SAFETY (NEEDLE) IMPLANT
NDL HYPO 22X1.5 SAFETY MO (MISCELLANEOUS) ×1 IMPLANT
NDL SPNL 18GX3.5 QUINCKE PK (NEEDLE) IMPLANT
NEEDLE HYPO 21X1.5 SAFETY (NEEDLE) ×1
NEEDLE HYPO 22X1.5 SAFETY MO (MISCELLANEOUS) ×1
NEEDLE SPNL 18GX3.5 QUINCKE PK (NEEDLE)
NS IRRIG 1000ML POUR BTL (IV SOLUTION) ×1 IMPLANT
PACK LAMINECTOMY NEURO (CUSTOM PROCEDURE TRAY) ×1 IMPLANT
PAD ARMBOARD 7.5X6 YLW CONV (MISCELLANEOUS) ×3 IMPLANT
PATTIES SURGICAL .25X.25 (GAUZE/BANDAGES/DRESSINGS) IMPLANT
PIN MAYFIELD SKULL DISP (PIN) ×1 IMPLANT
PUTTY DBM 5CC CALC GRAN (Putty) IMPLANT
ROD CRVD TI VIR BLUE 45X3.5MM (Rod) IMPLANT
SCREW VIRAGE 3.5X14 (Screw) IMPLANT
SPIKE FLUID TRANSFER (MISCELLANEOUS) ×1 IMPLANT
SPONGE NEURO XRAY DETECT 1X3 (DISPOSABLE) IMPLANT
SPONGE SURGIFOAM ABS GEL SZ50 (HEMOSTASIS) IMPLANT
SPONGE T-LAP 4X18 ~~LOC~~+RFID (SPONGE) IMPLANT
STAPLER SKIN PROX WIDE 3.9 (STAPLE) IMPLANT
STRIP CLOSURE SKIN 1/2X4 (GAUZE/BANDAGES/DRESSINGS) IMPLANT
SUT ETHILON 2 0 FS 18 (SUTURE) IMPLANT
SUT VIC AB 0 CT1 18XCR BRD8 (SUTURE) ×1 IMPLANT
SUT VIC AB 2-0 CP2 18 (SUTURE) ×1 IMPLANT
SYR 20ML LL LF (SYRINGE) IMPLANT
TOWEL GREEN STERILE (TOWEL DISPOSABLE) ×1 IMPLANT
TOWEL GREEN STERILE FF (TOWEL DISPOSABLE) ×1 IMPLANT
TRAY FOLEY MTR SLVR 16FR STAT (SET/KITS/TRAYS/PACK) IMPLANT
WATER STERILE IRR 1000ML POUR (IV SOLUTION) ×1 IMPLANT

## 2023-03-04 NOTE — Anesthesia Postprocedure Evaluation (Signed)
Anesthesia Post Note  Patient: Cameron [name]  Procedure(s) Performed: CERVICAL SIX-SEVEN FORAMINOTOMY, CERVICAL SIX- SEVEN POSTERIOR INSTRUMENTATION AND FUSION (Left)     Patient location during evaluation: PACU Anesthesia Type: General Level of consciousness: awake and alert Pain management: pain level controlled Vital Signs Assessment: post-procedure vital signs reviewed and stable Respiratory status: spontaneous breathing, nonlabored ventilation, respiratory function stable and patient connected to nasal cannula oxygen Cardiovascular status: blood pressure returned to baseline and stable Postop Assessment: no apparent nausea or vomiting Anesthetic complications: no  No notable events documented.  Last Vitals:  Vitals:   03/04/23 1242 03/04/23 1559  BP: 137/88 123/75  Pulse: 99 (!) 102  Resp: 20 18  Temp: 36.4 C 36.6 C  SpO2: 94% 97%    Last Pain:  Vitals:   03/04/23 1559  TempSrc: Oral  PainSc:                  [name]

## 2023-03-04 NOTE — Anesthesia Procedure Notes (Signed)
Procedure Name: Intubation Date/Time: 03/04/2023 7:58 AM  Performed by: April Holding, CRNAPre-anesthesia Checklist: Patient identified, Emergency Drugs available, Suction available and Patient being monitored Patient Re-evaluated:Patient Re-evaluated prior to induction Oxygen Delivery Method: Circle System Utilized Preoxygenation: Pre-oxygenation with 100% oxygen Induction Type: IV induction Ventilation: Mask ventilation without difficulty Laryngoscope Size: Glidescope and 4 Grade View: Grade II Tube type: Oral Tube size: 7.5 mm Number of attempts: 1 Airway Equipment and Method: Stylet, Oral airway and Bite block Placement Confirmation: ETT inserted through vocal cords under direct vision, positive ETCO2 and breath sounds checked- equal and bilateral Secured at: 24 cm Tube secured with: Tape Dental Injury: Teeth and Oropharynx as per pre-operative assessment

## 2023-03-04 NOTE — Progress Notes (Signed)
Orthopedic Tech Progress Note Patient Details:  Cameron [name] 06/08/1965 [id number]  Patient ID: Cameron [name], male   DOB: Jun 21, 1965, 58 y.o.   MRN: [id number] I delivered collar to pacu Cameron [name] 03/04/2023, 11:56 AM

## 2023-03-04 NOTE — H&P (Signed)
Subjective: The patient is a 58 year old white male on whom I performed a few 5 6 and C6-7 anterior cervicectomy fusion and plating.  He developed some neck pain.  He was worked up with x-rays and a CAT scan which demonstrated finding consistent with a cervical pseudoarthrosis.  I discussed the various treatment options with him.  He has decided proceed with surgery.  Past Medical History:  Diagnosis Date   Anxiety    Arthritis    generalized   Asthma    Cancer (HCC)    skin   Depression    Diabetes mellitus without complication (HCC)    type 2   Difficult intubation    has narrow airway d/t fusion and has needed camera in past procedures   History of kidney stones    Hypertension    Pneumonia    Sleep apnea     Past Surgical History:  Procedure Laterality Date   ANTERIOR CERVICAL DECOMP/DISCECTOMY FUSION N/A 01/27/2022   Procedure: ACDF,IP,PLATE/SCREWS Z61;WRUEAVW FUSION;REM CERV HARDWARE;  Surgeon: Tressie Stalker, MD;  Location: [facility] OR;  Service: Neurosurgery;  Laterality: N/A;  3C   BACK SURGERY     neck 2011 and 2013   BIOPSY  12/16/2022   Procedure: BIOPSY;  Surgeon: Meridee Score Netty Starring., MD;  Location: [facility];  Service: Gastroenterology;;   CARPAL TUNNEL RELEASE Right    COLONOSCOPY WITH PROPOFOL N/A 12/16/2022   Procedure: COLONOSCOPY WITH PROPOFOL;  Surgeon: Lemar Lofty., MD;  Location: [facility];  Service: Gastroenterology;  Laterality: N/A;   ESOPHAGOGASTRODUODENOSCOPY (EGD) WITH PROPOFOL N/A 12/16/2022   Procedure: ESOPHAGOGASTRODUODENOSCOPY (EGD) WITH PROPOFOL;  Surgeon: Meridee Score Netty Starring., MD;  Location: [facility];  Service: Gastroenterology;  Laterality: N/A;   HERNIA REPAIR     left inguinal hernia repair as a child   JOINT REPLACEMENT Left    total joint replacement   KNEE ARTHROSCOPY Left    NASAL SEPTUM SURGERY  1986   REPAIR / REINSERT BICEPS TENDON AT ELBOW Right 2023   ROTATOR CUFF REPAIR Left    and bicep repair   SAVORY  DILATION N/A 12/16/2022   Procedure: SAVORY DILATION;  Surgeon: Lemar Lofty., MD;  Location: [facility];  Service: Gastroenterology;  Laterality: N/A;   TENDON REPAIR Right    elbow   TOTAL HIP ARTHROPLASTY Left     Allergies  Allergen Reactions   Molds & Smuts Shortness Of Breath    Blue cheese   Penicillins Hives and Rash   Vancomycin Hives and Shortness Of Breath   Wound Dressing Adhesive Itching and Swelling    Peeling skin Surgical Glue   Pineapple Rash   Ibuprofen Itching    Scalp, Head itching    Oxycodone-Acetaminophen Itching   Pecan Nut (Diagnostic) Itching    Mouth sores/throat itches   Latex Rash    Social History   Tobacco Use   Smoking status: Never   Smokeless tobacco: Never  Substance Use Topics   Alcohol use: Yes    Comment: rarely    Family History  Problem Relation Age of Onset   Colon cancer Neg Hx    Esophageal cancer Neg Hx    Inflammatory bowel disease Neg Hx    Liver disease Neg Hx    Pancreatic cancer Neg Hx    Rectal cancer Neg Hx    Stomach cancer Neg Hx    Prior to Admission medications   Medication Sig Start Date End Date Taking? Authorizing Provider  acetaminophen (TYLENOL) 500 MG tablet  Take 1,000 mg by mouth every 6 (six) hours as needed for mild pain (pain score 1-3), moderate pain (pain score 4-6) or headache.   Yes [provider]  atorvastatin (LIPITOR) 20 MG tablet Take 20 mg by mouth at bedtime. 11/05/21  Yes [provider]  fluticasone (FLONASE) 50 MCG/ACT nasal spray Place 2 sprays into both nostrils daily. 02/06/12  Yes [provider]  levocetirizine (XYZAL) 5 MG tablet Take 5 mg by mouth 2 (two) times daily. 03/19/21  Yes [provider]  metFORMIN (GLUCOPHAGE) 1000 MG tablet Take 1,000 mg by mouth at bedtime.   Yes [provider]  mometasone-formoterol (DULERA) 100-5 MCG/ACT AERO Inhale 2 puffs into the lungs 4 (four) times daily. 06/10/22  Yes [provider]   montelukast (SINGULAIR) 10 MG tablet Take 10 mg by mouth at bedtime. 07/15/17  Yes [provider]  Olopatadine HCl (PATADAY) 0.2 % SOLN Place 1 drop into both eyes 2 (two) times daily as needed (eye allergies).   Yes [provider]  ondansetron (ZOFRAN-ODT) 4 MG disintegrating tablet Take 4 mg by mouth every 8 (eight) hours as needed for nausea or vomiting. 04/28/18  Yes [provider]  rizatriptan (MAXALT) 10 MG tablet Take 10 mg by mouth as needed for migraine. May repeat in 2 hours if needed   Yes [provider]  Tiotropium Bromide Monohydrate 1.25 MCG/ACT AERS Inhale 1.25 mcg into the lungs daily. 2 puff   Yes [provider]  traZODone (DESYREL) 50 MG tablet Take 50-150 mg by mouth at bedtime as needed for sleep. 06/03/21  Yes [provider]  triamcinolone ointment (KENALOG) 0.1 % Apply 1 Application topically 2 (two) times daily as needed Evie Lacks).   Yes [provider]  Venlafaxine HCl 225 MG TB24 Take 225 mg by mouth daily with breakfast. 05/24/21  Yes [provider]  Wheat Dextrin (BENEFIBER DRINK MIX PO) Take 30 mLs by mouth daily as needed (Constipation).   Yes [provider]  albuterol (ACCUNEB) 0.63 MG/3ML nebulizer solution Take 1 ampule by nebulization every 4 (four) hours as needed for wheezing.    [provider]  albuterol (VENTOLIN HFA) 108 (90 Base) MCG/ACT inhaler Inhale 2 puffs into the lungs every 6 (six) hours as needed for wheezing or shortness of breath.    [provider]  azelastine (ASTELIN) 0.1 % nasal spray Place 1 spray into both nostrils daily as needed for allergies. 02/24/18 07/29/23  [provider]  diclofenac Sodium (VOLTAREN) 1 % GEL Apply 1 g topically daily as needed (Joint pain). 08/02/20   [provider]  docusate sodium (COLACE) 100 MG capsule Take 1 capsule (100 mg total) by mouth 2 (two) times daily. Patient taking differently: Take 100 mg  by mouth daily as needed for moderate constipation or mild constipation. 01/28/22   Tressie Stalker, MD  predniSONE (DELTASONE) 20 MG tablet Take 20 mg by mouth daily as needed (asthma). 12/05/21   [provider]     Review of Systems  Positive ROS: As above  All other systems have been reviewed and were otherwise negative with the exception of those mentioned in the HPI and as above.  Objective: Vital signs in last 24 hours: Temp:  [98.6 F (37 C)] 98.6 F (37 C) (01/22 0545) Pulse Rate:  [70] 70 (01/22 0545) Resp:  [18] 18 (01/22 0545) BP: (129)/(88) 129/88 (01/22 0545) SpO2:  [97 %] 97 % (01/22 0545) Weight:  [99.8 kg] 99.8 kg (  01/22 0545) Estimated body mass index is 33.45 kg/m as calculated from the following:   Height as of this encounter: 5\' 8"  (1.727 m).   Weight as of this encounter: 99.8 kg.   General Appearance: Alert Head: Normocephalic, without obvious abnormality, atraumatic Eyes: PERRL, conjunctiva/corneas clear, EOM's intact,    Ears: Normal  Throat: Normal  Neck: Anterior cervical incision is well-healed.  He has limited cervical range of motion. Back: unremarkable Lungs: Clear to auscultation bilaterally, respirations unlabored Heart: Regular rate and rhythm, no murmur, rub or gallop Abdomen: Soft, non-tender Extremities: Extremities normal, atraumatic, no cyanosis or edema Skin: unremarkable  NEUROLOGIC:   Mental status: alert and oriented,Motor Exam - grossly normal Sensory Exam - grossly normal Reflexes:  Coordination - grossly normal Gait - grossly normal Balance - grossly normal Cranial Nerves: I: smell Not tested  II: visual acuity  OS: Normal  OD: Normal   II: visual fields Full to confrontation  II: pupils Equal, round, reactive to light  III,VII: ptosis None  III,IV,VI: extraocular muscles  Full ROM  V: mastication Normal  V: facial light touch sensation  Normal  V,VII: corneal reflex  Present  VII: facial muscle function  - upper  Normal  VII: facial muscle function - lower Normal  VIII: hearing Not tested  IX: soft palate elevation  Normal  IX,X: gag reflex Present  XI: trapezius strength  5/5  XI: sternocleidomastoid strength 5/5  XI: neck flexion strength  5/5  XII: tongue strength  Normal    Data Review Lab Results  Component Value Date   WBC 6.1 02/25/2023   HGB 14.2 02/25/2023   HCT 43.7 02/25/2023   MCV 91.8 02/25/2023   PLT 183 02/25/2023   Lab Results  Component Value Date   NA 136 02/25/2023   K 3.6 02/25/2023   CL 101 02/25/2023   CO2 28 02/25/2023   BUN 16 02/25/2023   CREATININE 0.88 02/25/2023   GLUCOSE 164 (H) 02/25/2023   No results found for: "INR", "PROTIME"  Assessment/Plan: Cervicalgia, cervical pseudoarthrosis, cervical radiculopathy, cervical foraminal stenosis: I have discussed the situation with the patient.  We have discussed the various treatment options including surgery.  I described the surgical treatment option of posterior cervical instrumentation at C6-7, and possibly C5-6 with a left C6-7 foraminotomy.  I have shown him surgical models.  I have given him a surgical pamphlet.  We have discussed the risk, benefits, alternative, the expected postoperative course, and likelihood of achieving our goals with surgery.  I have answered all his questions.  He has decided proceed with surgery.   Cristi Loron 03/04/2023 7:26 AM

## 2023-03-04 NOTE — Op Note (Signed)
Brief history: The patient is a 58 year old white male whose had a C5-6 and C6-7 anterior cervical discectomy fusion and plating.  He has developed some persistent neck pain.  He was worked up with a CT scan which demonstrated findings consistent with a cervical pseudoarthrosis and left neuroforaminal stenosis.  I discussed the various treatment options with him.  He has decided proceed with surgery.  Preop diagnosis: Cervical pseudoarthrosis, cervical foraminal stenosis, cervicalgia  Postop diagnosis: The same  Procedure: Left C6-7 laminotomy foraminotomies using microdissection to decompress the left C7 nerve root; C5-6 and C6-7 posterior arthrodesis with local morselized autograft bone and intra grow bone graft extender; posterior cervical instrumentation C5-C7 with Zimmer titanium screws and rods  Surgeon: Dr. Delma Officer  Assistant: Hildred Priest, NP  Anesthesia: General Tracheal  Estimated blood loss: 75 cc  Specimens: None  Drains: None  Complications: None  Description of procedure: The patient was brought to the operating room by the anesthesia team.  General endotracheal anesthesia was induced.  I applied the Mayfield 3 point headrest to his calvarium.  He was carefully turned to the prone position on the chest rolls with his head supported in the Mayfield headrest.  The patient's suboccipital region was then shaved with a clippers in this region his posterior cervical and upper thoracic region was then prepared with Betadine scrub and Betadine solution.  Sterile drapes were applied.  I then injected the area to be incised with Marcaine with epinephrine solution.  I used a scalpel to make a linear midline incision over the C5-6 and C6-7 interspace.  I used electrocautery to perform a bilateral subperiosteal dissection exposing the spinous process and lamina at C5-6 and C6-7.  We obtained an intraoperative radiograph to confirm our location.  I inserted the McCullough retractor for  exposure.  I then used a Kocher to grasp the C5, C6 and C7 spinous processes.  I pulled on the spinous processes and noted that the facets were still clearly mobile at C6-7 and mildly mobile at C5-6 indicative of a pseudoarthrosis at both levels.  We brought the operative microscope into the field for the decompression.  I used a high-speed drill to perform a left C6-7 laminotomy.  I then performed a foraminotomies about the left C7 nerve root removing the ligamentum flavum and decompressing the nerve root laterally.  We now turned our attention to the instrumentation.  Preoperative fluoroscopy was not useful because of the patient's shoulders.  I then identified the center of the lateral masses at C5, C6 and C7 bilaterally.  I drilled a 14 mm pilot hole aiming cephalad and laterally into the lateral masses bilaterally at C5, C6 and C7.  I removed the drill and probe inside the hole to rule out cortical breaches.  There were none.  I then inserted a 3.5 x 14 mm lateral mass screw into the lateral masses bilaterally at C5, C6 and C7.  We got good bony purchase.  I then connected the unilateral screws with a lordotic rod.  We secured the rod in place with the caps which we tightened appropriately.  This completed instrumentation from C5-C7 bilaterally.  We now turned attention to the arthrodesis.  I used a high-speed drill to decorticate the lateral lamina, facets, etc. at C5-6 and C6-7 bilaterally.  We used some of the bone we drilled away as local autograft bone.  I then placed intra grow bone graft extender over these decorticated posterolateral structures at C5-6 and C6-7 bilaterally completing the arthrodesis.  We then obtained hemostasis with bipolar cautery.  We irrigated the wound out with saline solution.  We removed the retractor.  I injected Exparel.  We then reapproximated the patient's cervical thoracic fascia with interrupted 0 Vicryl suture.  We reapproximated the subcutaneous tissue with  interrupted 2-0 Vicryl suture.  We reapproximated the skin with Steri-Strips and benzoin.  The wound was then coated with bacitracin ointment.  A sterile dressing was applied.  The drapes were removed.  He was then returned to the supine position.  I then removed the Mayfield 3 point headrest from his calvarium.  He had some bleeding from one of the pedal sites which was controlled with direct pressure and 3 stainless steel staples.  By report all sponge, instrument, and needle counts were correct at the end of this case.

## 2023-03-04 NOTE — Plan of Care (Signed)

## 2023-03-04 NOTE — Transfer of Care (Signed)
Immediate Anesthesia Transfer of Care Note  Patient: Cameron [name]  Procedure(s) Performed: CERVICAL SIX-SEVEN FORAMINOTOMY, CERVICAL SIX- SEVEN POSTERIOR INSTRUMENTATION AND FUSION (Left)  Patient Location: PACU  Anesthesia Type:General  Level of Consciousness: awake and alert   Airway & Oxygen Therapy: Patient Spontanous Breathing and Patient connected to face mask oxygen  Post-op Assessment: Report given to RN and Post -op Vital signs reviewed and stable  Post vital signs: Reviewed and stable  Last Vitals:  Vitals Value Taken Time  BP 145/79 03/04/23 1049  Temp    Pulse 108 03/04/23 1051  Resp 29 03/04/23 1051  SpO2 98 % 03/04/23 1051  Vitals shown include unfiled device data.  Last Pain:  Vitals:   03/04/23 0601  TempSrc:   PainSc: 8       Patients Stated Pain Goal: 1 (03/04/23 0601)  Complications: No notable events documented.

## 2023-03-05 ENCOUNTER — Encounter (HOSPITAL_COMMUNITY): Payer: Self-pay | Admitting: Neurosurgery

## 2023-03-05 LAB — GLUCOSE, CAPILLARY: Glucose-Capillary: 153 mg/dL — ABNORMAL HIGH (ref 70–99)

## 2023-03-05 MED ORDER — HYDROMORPHONE HCL 2 MG PO TABS: 2.0000 mg | ORAL_TABLET | ORAL | 0 refills | Status: DC | PRN

## 2023-03-05 MED ORDER — DIPHENHYDRAMINE HCL 25 MG PO CAPS
25.0000 mg | ORAL_CAPSULE | Freq: Four times a day (QID) | ORAL | Status: DC | PRN
  Administered 2023-03-05: 25 mg via ORAL
  Filled 2023-03-05: qty 1

## 2023-03-05 MED ORDER — ALBUTEROL SULFATE (2.5 MG/3ML) 0.083% IN NEBU
2.5000 mg | INHALATION_SOLUTION | RESPIRATORY_TRACT | Status: DC | PRN
  Administered 2023-03-05: 2.5 mg via RESPIRATORY_TRACT
  Filled 2023-03-05: qty 3

## 2023-03-05 MED ORDER — CYCLOBENZAPRINE HCL 10 MG PO TABS: 10.0000 mg | ORAL_TABLET | Freq: Three times a day (TID) | ORAL | 0 refills | Status: AC | PRN

## 2023-03-05 NOTE — Plan of Care (Signed)
Pt doing well. Pt and wife given D/C instructions with verbal understanding. Rx's were sent to the pharmacy by MD. Pt's incision is clean and dry with no sign of infection. Pt's IV was removed prior to D/C. Pt D/C'd home via wheelchair per MD order. Pt is stable @ D/C and has no other needs at this time. Amandine Covino, RN  

## 2023-03-05 NOTE — Evaluation (Signed)
Occupational Therapy Evaluation Patient Details Name: Cameron [name] MRN: [id number] DOB: 1965/03/16 Today's Date: 03/05/2023   History of Present Illness Josem Kaufmann. Willever is a 58 y.o. male presenting with persistent neck pain. CT scan demonstrated findings consistent with a cervical pseudoarthrosis and left neuroforaminal stenosis. Surgical procedure: C6-C7 foraminotomy, C6-C7 posterior instrumentation and fusion 03/04/2023. PMHx: Anxiety, arthritis generalized, asthma, cancer, depression, hypertension, sleep apnea, and diabetes mellitus without complication type 2.   Clinical Impression   Hilton was evaluated s/p the above spine surgery. He is mod I with RW at baseline. Upon evaluation pt was limited by cervical precautions, surgical pain, decreased activity tolerance and generalized weakness. Overall he is able to complete all ADLs and mobility with mod I with great knowledge of compensatory techniques from prior surgeries. Provided cues and education on spinal precautions and compensatory techniques throughout, handout provided and pt demonstrated great recall during ADLs and mobility. Pt does not require further acute OT services. Recommend d/c home with support of family.          If plan is discharge home, recommend the following: Assistance with cooking/housework;Assist for transportation    Functional Status Assessment  Patient has had a recent decline in their functional status and demonstrates the ability to make significant improvements in function in a reasonable and predictable amount of time.  Equipment Recommendations  None recommended by OT       Precautions / Restrictions Precautions Precautions: Fall;Cervical Precaution Booklet Issued: Yes (comment) Required Braces or Orthoses: Cervical Brace Cervical Brace: Hard collar Restrictions Weight Bearing Restrictions Per Provider Order: No      Mobility Bed Mobility Overal bed mobility: Modified Independent              General bed mobility comments: with log roll    Transfers Overall transfer level: Modified independent Equipment used: Rolling walker (2 wheels)                      Balance Overall balance assessment: Needs assistance Sitting-balance support: Feet supported Sitting balance-Leahy Scale: Good     Standing balance support: During functional activity, Single extremity supported Standing balance-Leahy Scale: Fair                             ADL either performed or assessed with clinical judgement   ADL Overall ADL's : Modified independent                                       General ADL Comments: pt able to complete ADLs with mod I after review of compensatory techniques, RW used for all functional mobiity     Vision Baseline Vision/History: 0 No visual deficits Vision Assessment?: No apparent visual deficits     Perception Perception: Within Functional Limits       Praxis Praxis: WFL       Pertinent Vitals/Pain Pain Assessment Pain Assessment: Faces Faces Pain Scale: Hurts little more Pain Location: neck Pain Descriptors / Indicators: Discomfort     Extremity/Trunk Assessment Upper Extremity Assessment Upper Extremity Assessment: LUE deficits/detail LUE Deficits / Details: numbness in 2-3rd digist LUE Sensation: decreased light touch LUE Coordination: WNL   Lower Extremity Assessment Lower Extremity Assessment: Overall WFL for tasks assessed   Cervical / Trunk Assessment Cervical / Trunk Assessment: Neck Surgery   Communication Communication Communication: No apparent  difficulties   Cognition Arousal: Alert Behavior During Therapy: WFL for tasks assessed/performed Overall Cognitive Status: Within Functional Limits for tasks assessed                                       General Comments  VSS, pt with reports of new rash. RM aware    Exercises     Shoulder Instructions      Home  Living Family/patient expects to be discharged to:: Private residence Living Arrangements: Spouse/significant other Available Help at Discharge: Family;Available 24 hours/day Type of Home: House Home Access: Stairs to enter [employer] of Steps: 1   Home Layout: One level     Bathroom Shower/Tub: Producer, television/film/video: Standard     Home Equipment: Grab bars - toilet;Grab bars - tub/shower;Rolling Walker (2 wheels);Cane - single point          Prior Functioning/Environment Prior Level of Function : Independent/Modified Independent;Driving             Mobility Comments: ambulates with RW ADLs Comments: mod I        OT Problem List: Decreased activity tolerance;Decreased knowledge of precautions;Decreased safety awareness         OT Goals(Current goals can be found in the care plan section) Acute Rehab OT Goals Patient Stated Goal: home OT Goal Formulation: With patient Time For Goal Achievement: 03/19/23 Potential to Achieve Goals: Good   AM-PAC OT "6 Clicks" Daily Activity     Outcome Measure Help from another person eating meals?: None Help from another person taking care of personal grooming?: None Help from another person toileting, which includes using toliet, bedpan, or urinal?: None Help from another person bathing (including washing, rinsing, drying)?: None Help from another person to put on and taking off regular upper body clothing?: None Help from another person to put on and taking off regular lower body clothing?: None 6 Click Score: 24   End of Session Equipment Utilized During Treatment: Rolling walker (2 wheels);Cervical collar Nurse Communication: Mobility status  Activity Tolerance: Patient tolerated treatment well Patient left: in bed;with call bell/phone within reach  OT Visit Diagnosis: Pain;Muscle weakness (generalized) (M62.81)                Time: 1324-4010 OT Time Calculation (min): 18 min Charges:  OT General  Charges $OT Visit: 1 Visit OT Evaluation $OT Eval Low Complexity: 1 Low  Derenda Mis, OTR/L Acute Rehabilitation Services Office [phone] Secure Chat Communication Preferred   Donia Pounds 03/05/2023, 9:48 AM

## 2023-03-05 NOTE — Discharge Summary (Signed)
Physician Discharge Summary  Patient ID: Cameron [name] MRN: [id number] DOB/AGE: 18-Mar-1965 58 y.o.  Admit date: 03/04/2023 Discharge date: 03/05/2023  Admission Diagnoses: Cervicalgia, cervical radiculopathy, cervical neuroforaminal stenosis, cervical pseudoarthrosis  Discharge Diagnoses: The same Principal Problem:   Cervical pseudoarthrosis [facility])   Discharged Condition: good  Hospital Course: I performed a C5-6 and C6-7 instrumentation and fusion on the patient on 03/04/2023.  The surgery went well.  The patient postoperative course was unremarkable.  On postoperative day #1 he felt well and requested discharge to home.  The patient was given verbal and written discharge instructions.  All his questions were answered.  Consults: OT, care management Significant Diagnostic Studies: None Treatments: Left C6-7 laminotomy/foraminotomy using microdissection, C5-6 and C6-7 posterior instrumentation and fusion. Discharge Exam: Blood pressure 137/73, pulse 85, temperature 97.7 F (36.5 C), temperature source Oral, resp. rate 18, height 5\' 8"  (1.727 m), weight 99.8 kg, SpO2 98%. The patient is alert and pleasant.  He looks well.  His strength is normal.  Disposition: Home  Discharge Instructions     Call MD for:  difficulty breathing, headache or visual disturbances   Complete by: As directed    Call MD for:  extreme fatigue   Complete by: As directed    Call MD for:  hives   Complete by: As directed    Call MD for:  persistant dizziness or light-headedness   Complete by: As directed    Call MD for:  persistant nausea and vomiting   Complete by: As directed    Call MD for:  redness, tenderness, or signs of infection (pain, swelling, redness, odor or green/yellow discharge around incision site)   Complete by: As directed    Call MD for:  severe uncontrolled pain   Complete by: As directed    Call MD for:  temperature >100.4   Complete by: As directed    Diet - low sodium  heart healthy   Complete by: As directed    Discharge instructions   Complete by: As directed    Call [phone] for a followup appointment. Take a stool softener while you are using pain medications.   Driving Restrictions   Complete by: As directed    Do not drive for 2 weeks.   Increase activity slowly   Complete by: As directed    Lifting restrictions   Complete by: As directed    Do not lift more than 5 pounds. No excessive bending or twisting.   May shower / Bathe   Complete by: As directed    Remove the dressing for 3 days after surgery.  You may shower, but leave the incision alone.   Remove dressing in 48 hours   Complete by: As directed       Allergies as of 03/05/2023       Reactions   Molds & Smuts Shortness Of Breath   Blue cheese   Penicillins Hives, Rash   Vancomycin Hives, Shortness Of Breath   Wound Dressing Adhesive Itching, Swelling   Peeling skin Surgical Glue   Pineapple Rash   Ibuprofen Itching   Scalp, Head itching    Oxycodone-acetaminophen Itching   Pecan Nut (diagnostic) Itching   Mouth sores/throat itches   Latex Rash        Medication List     TAKE these medications    acetaminophen 500 MG tablet Commonly known as: TYLENOL Take 1,000 mg by mouth every 6 (six) hours as needed for mild pain (pain score 1-3),  moderate pain (pain score 4-6) or headache.   albuterol 108 (90 Base) MCG/ACT inhaler Commonly known as: VENTOLIN HFA Inhale 2 puffs into the lungs every 6 (six) hours as needed for wheezing or shortness of breath.   albuterol 0.63 MG/3ML nebulizer solution Commonly known as: ACCUNEB Take 1 ampule by nebulization every 4 (four) hours as needed for wheezing.   atorvastatin 20 MG tablet Commonly known as: LIPITOR Take 20 mg by mouth at bedtime.   azelastine 0.1 % nasal spray Commonly known as: ASTELIN Place 1 spray into both nostrils daily as needed for allergies.   BENEFIBER DRINK MIX PO Take 30 mLs by mouth daily as  needed (Constipation).   cyclobenzaprine 10 MG tablet Commonly known as: FLEXERIL Take 1 tablet (10 mg total) by mouth 3 (three) times daily as needed for muscle spasms.   diclofenac Sodium 1 % Gel Commonly known as: VOLTAREN Apply 1 g topically daily as needed (Joint pain).   docusate sodium 100 MG capsule Commonly known as: COLACE Take 1 capsule (100 mg total) by mouth 2 (two) times daily.   fluticasone 50 MCG/ACT nasal spray Commonly known as: FLONASE Place 2 sprays into both nostrils daily.   HYDROmorphone 2 MG tablet Commonly known as: DILAUDID Take 1-2 tablets (2-4 mg total) by mouth every 4 (four) hours as needed for moderate pain (pain score 4-6).   levocetirizine 5 MG tablet Commonly known as: XYZAL Take 5 mg by mouth 2 (two) times daily.   metFORMIN 1000 MG tablet Commonly known as: GLUCOPHAGE Take 1,000 mg by mouth at bedtime.   mometasone-formoterol 100-5 MCG/ACT Aero Commonly known as: DULERA Inhale 2 puffs into the lungs 4 (four) times daily.   montelukast 10 MG tablet Commonly known as: SINGULAIR Take 10 mg by mouth at bedtime.   ondansetron 4 MG disintegrating tablet Commonly known as: ZOFRAN-ODT Take 4 mg by mouth every 8 (eight) hours as needed for nausea or vomiting.   Pataday 0.2 % Soln Generic drug: Olopatadine HCl Place 1 drop into both eyes 2 (two) times daily as needed (eye allergies).   predniSONE 20 MG tablet Commonly known as: DELTASONE Take 20 mg by mouth daily as needed (asthma).   rizatriptan 10 MG tablet Commonly known as: MAXALT Take 10 mg by mouth as needed for migraine. May repeat in 2 hours if needed   Tiotropium Bromide Monohydrate 1.25 MCG/ACT Aers Inhale 1.25 mcg into the lungs daily. 2 puff   traZODone 50 MG tablet Commonly known as: DESYREL Take 50-150 mg by mouth at bedtime as needed for sleep.   triamcinolone ointment 0.1 % Commonly known as: KENALOG Apply 1 Application topically 2 (two) times daily as needed  Evie Lacks).   Venlafaxine HCl 225 MG Tb24 Take 225 mg by mouth daily with breakfast.         Signed: Cristi Loron 03/05/2023, 9:26 AM

## 2023-03-08 ENCOUNTER — Encounter: Payer: Self-pay | Admitting: Gastroenterology

## 2023-03-09 ENCOUNTER — Other Ambulatory Visit: Payer: Self-pay

## 2023-03-09 MED ORDER — LINACLOTIDE 145 MCG PO CAPS: 145.0000 ug | ORAL_CAPSULE | Freq: Every day | ORAL | 3 refills | Status: DC

## 2023-03-13 ENCOUNTER — Other Ambulatory Visit: Payer: Self-pay

## 2023-03-13 MED ORDER — LINACLOTIDE 145 MCG PO CAPS: 145.0000 ug | ORAL_CAPSULE | Freq: Every day | ORAL | 3 refills | Status: DC

## 2023-03-13 NOTE — Telephone Encounter (Signed)
Refill re-sent to [facility] pharmacy- [facility].

## 2023-03-30 ENCOUNTER — Encounter: Payer: Self-pay | Admitting: Gastroenterology

## 2023-03-30 NOTE — Telephone Encounter (Signed)
 Ro I asked the pt to call the office to make appt but wanted to be sure you did not need to see this for any reason.

## 2023-05-07 ENCOUNTER — Encounter: Payer: Self-pay | Admitting: Physician Assistant

## 2023-05-07 ENCOUNTER — Ambulatory Visit (INDEPENDENT_AMBULATORY_CARE_PROVIDER_SITE_OTHER): Payer: No Typology Code available for payment source | Admitting: Physician Assistant

## 2023-05-07 VITALS — BP 122/70 | HR 77 | Ht 68.0 in | Wt 224.0 lb

## 2023-05-07 DIAGNOSIS — K640 First degree hemorrhoids: Secondary | ICD-10-CM | POA: Diagnosis not present

## 2023-05-07 DIAGNOSIS — L309 Dermatitis, unspecified: Secondary | ICD-10-CM

## 2023-05-07 DIAGNOSIS — K6289 Other specified diseases of anus and rectum: Secondary | ICD-10-CM | POA: Diagnosis not present

## 2023-05-07 DIAGNOSIS — K5909 Other constipation: Secondary | ICD-10-CM

## 2023-05-07 MED ORDER — LINACLOTIDE 145 MCG PO CAPS: 145.0000 ug | ORAL_CAPSULE | Freq: Every day | ORAL | 3 refills | Status: DC

## 2023-05-07 NOTE — Progress Notes (Signed)
 Chief Complaint: Rectal pain, bleeding and burning  HPI:    [name] is a 58 year old male with a past medical history as listed below including asthma, depression, diabetes and multiple others as well as difficult intubation, known to Dr. Meridee Score, who presents to clinic today for complaint of rectal bleeding and irritation.    12/16/2022 colonoscopy done at [facility] with hemorrhoids, stool in the entire colon and nonbleeding nonthrombosed internal hemorrhoids.  Patient told to start fiber and consider medication for constipation.  Repeat colonoscopy recommended 10 years.    02/14/2022 EGD with no gross lesions in the entire esophagus, dilated to 18 mm, erythematous mucosa in the stomach and otherwise normal.  Discussed that if issues of dysphagia persisted then it was more of a component from previous cervical surgery but could consider esophageal manometry.  If dysphagia improved after empiric dilation then could repeat in the future.    02/25/2023 CBC normal.    03/08/2023 patient called in for prescription of Linzess 145 mcg daily.    04/04/2023 hemoglobin 12.9.    Today, the patient presents to clinic and tells me that he always had issues of going from diarrhea to constipation but with the use of Linzess 145 mcg which she takes actually as needed he is able to have a bowel movement every 3 to 5 days.  This is much improved for him and he is very happy with the way it works.  Most recently though over the past few weeks he has noticed that he thinks he has a fissure which has come back.  Describes having one about 10 years ago as well as hemorrhoids and he would like to know what is going on.  He is getting some seeping in his underwear and having to get up in the middle the night to change them.  He has been using Calmoseptine on this.  Apparently has seen a dermatologist about a year and a half ago but they did a swab of this area and were not really able to tell him what was going on.   It has never gone away at least over the past 8 years completely.  It seems to get worse and sometimes get slightly better.Very uncomfortable.    Denies fever, chills or weight loss.      Past Medical History:  Diagnosis Date   Anxiety    Arthritis    generalized   Asthma    Cancer (HCC)    skin   Depression    Diabetes mellitus without complication (HCC)    type 2   Difficult intubation    has narrow airway d/t fusion and has needed camera in past procedures   History of kidney stones    Hypertension    Pneumonia    Sleep apnea     Past Surgical History:  Procedure Laterality Date   ANTERIOR CERVICAL DECOMP/DISCECTOMY FUSION N/A 01/27/2022   Procedure: ACDF,IP,PLATE/SCREWS Z61;WRUEAVW FUSION;REM CERV HARDWARE;  Surgeon: Tressie Stalker, MD;  Location: [facility] OR;  Service: Neurosurgery;  Laterality: N/A;  3C   BACK SURGERY     neck 2011 and 2013   BIOPSY  12/16/2022   Procedure: BIOPSY;  Surgeon: Meridee Score Netty Starring., MD;  Location: [facility];  Service: Gastroenterology;;   CARPAL TUNNEL RELEASE Right    COLONOSCOPY WITH PROPOFOL N/A 12/16/2022   Procedure: COLONOSCOPY WITH PROPOFOL;  Surgeon: Lemar Lofty., MD;  Location: [facility];  Service: Gastroenterology;  Laterality: N/A;   ESOPHAGOGASTRODUODENOSCOPY (EGD) WITH  PROPOFOL N/A 12/16/2022   Procedure: ESOPHAGOGASTRODUODENOSCOPY (EGD) WITH PROPOFOL;  Surgeon: Meridee Score Netty Starring., MD;  Location: [facility];  Service: Gastroenterology;  Laterality: N/A;   HERNIA REPAIR     left inguinal hernia repair as a child   JOINT REPLACEMENT Left    total joint replacement   KNEE ARTHROSCOPY Left    NASAL SEPTUM SURGERY  1986   POSTERIOR CERVICAL FUSION/FORAMINOTOMY Left 03/04/2023   Procedure: CERVICAL SIX-SEVEN FORAMINOTOMY, CERVICAL SIX- SEVEN POSTERIOR INSTRUMENTATION AND FUSION;  Surgeon: Tressie Stalker, MD;  Location: [facility] OR;  Service: Neurosurgery;  Laterality: Left;  3C   REPAIR / REINSERT BICEPS TENDON AT  ELBOW Right 2023   ROTATOR CUFF REPAIR Left    and bicep repair   SAVORY DILATION N/A 12/16/2022   Procedure: SAVORY DILATION;  Surgeon: Lemar Lofty., MD;  Location: [facility];  Service: Gastroenterology;  Laterality: N/A;   TENDON REPAIR Right    elbow   TOTAL HIP ARTHROPLASTY Left     Current Outpatient Medications  Medication Sig Dispense Refill   acetaminophen (TYLENOL) 500 MG tablet Take 1,000 mg by mouth every 6 (six) hours as needed for mild pain (pain score 1-3), moderate pain (pain score 4-6) or headache.     albuterol (ACCUNEB) 0.63 MG/3ML nebulizer solution Take 1 ampule by nebulization every 4 (four) hours as needed for wheezing.     albuterol (VENTOLIN HFA) 108 (90 Base) MCG/ACT inhaler Inhale 2 puffs into the lungs every 6 (six) hours as needed for wheezing or shortness of breath.     atorvastatin (LIPITOR) 20 MG tablet Take 20 mg by mouth at bedtime.     azelastine (ASTELIN) 0.1 % nasal spray Place 1 spray into both nostrils daily as needed for allergies.     cyclobenzaprine (FLEXERIL) 10 MG tablet Take 1 tablet (10 mg total) by mouth 3 (three) times daily as needed for muscle spasms. 30 tablet 0   diclofenac Sodium (VOLTAREN) 1 % GEL Apply 1 g topically daily as needed (Joint pain).     fluticasone (FLONASE) 50 MCG/ACT nasal spray Place 2 sprays into both nostrils daily.     levocetirizine (XYZAL) 5 MG tablet Take 5 mg by mouth 2 (two) times daily.     metFORMIN (GLUCOPHAGE) 1000 MG tablet Take 1,000 mg by mouth at bedtime.     mometasone-formoterol (DULERA) 100-5 MCG/ACT AERO Inhale 2 puffs into the lungs 4 (four) times daily.     montelukast (SINGULAIR) 10 MG tablet Take 10 mg by mouth at bedtime.     Olopatadine HCl (PATADAY) 0.2 % SOLN Place 1 drop into both eyes 2 (two) times daily as needed (eye allergies).     ondansetron (ZOFRAN-ODT) 4 MG disintegrating tablet Take 4 mg by mouth every 8 (eight) hours as needed for nausea or vomiting.     predniSONE  (DELTASONE) 20 MG tablet Take 20 mg by mouth daily as needed (asthma).     rizatriptan (MAXALT) 10 MG tablet Take 10 mg by mouth as needed for migraine. May repeat in 2 hours if needed     Tiotropium Bromide Monohydrate 1.25 MCG/ACT AERS Inhale 1.25 mcg into the lungs daily. 2 puff     traZODone (DESYREL) 50 MG tablet Take 50-150 mg by mouth at bedtime as needed for sleep.     triamcinolone ointment (KENALOG) 0.1 % Apply 1 Application topically 2 (two) times daily as needed Evie Lacks).     Venlafaxine HCl 225 MG TB24 Take 225 mg by mouth daily  with breakfast.     Wheat Dextrin (BENEFIBER DRINK MIX PO) Take 30 mLs by mouth daily as needed (Constipation).     docusate sodium (COLACE) 100 MG capsule Take 1 capsule (100 mg total) by mouth 2 (two) times daily. (Patient not taking: Reported on 05/07/2023) 30 capsule 0   HYDROmorphone (DILAUDID) 2 MG tablet Take 1-2 tablets (2-4 mg total) by mouth every 4 (four) hours as needed for moderate pain (pain score 4-6). (Patient not taking: Reported on 05/07/2023) 30 tablet 0   linaclotide (LINZESS) 145 MCG CAPS capsule Take 1 capsule (145 mcg total) by mouth daily before breakfast. (Patient not taking: Reported on 05/07/2023) 90 capsule 3   No current facility-administered medications for this visit.    Allergies as of 05/07/2023 - Review Complete 05/07/2023  Allergen Reaction Noted   Molds & smuts Shortness Of Breath 01/27/2022   Penicillins Hives and Rash 08/02/2009   Vancomycin Hives and Shortness Of Breath 01/27/2011   Wound dressing adhesive Itching and Swelling 08/04/2022   Pineapple Rash 08/20/2012   Ibuprofen Itching 01/15/2022   Oxycodone-acetaminophen Itching 02/23/2023   Pecan nut (diagnostic) Itching 01/27/2022   Latex Rash 01/27/2011    Family History  Problem Relation Age of Onset   Colon cancer Neg Hx    Esophageal cancer Neg Hx    Inflammatory bowel disease Neg Hx    Liver disease Neg Hx    Pancreatic cancer Neg Hx    Rectal cancer  Neg Hx    Stomach cancer Neg Hx     Social History   Socioeconomic History   Marital status: Married    Spouse name: Not on file   Number of children: Not on file   Years of education: Not on file   Highest education level: Not on file  Occupational History   Not on file  Tobacco Use   Smoking status: Never   Smokeless tobacco: Never  Vaping Use   Vaping status: Never Used  Substance and Sexual Activity   Alcohol use: Yes    Comment: rarely   Drug use: Never   Sexual activity: Not on file  Other Topics Concern   Not on file  Social History Narrative   Not on file   Social Drivers of Health   Financial Resource Strain: Not on file  Food Insecurity: No Food Insecurity (07/30/2022)   Received from [facility]   Hunger Vital Sign    Worried About Running Out of Food in the Last Year: Never true    Ran Out of Food in the Last Year: Never true  Transportation Needs: Not on file  Physical Activity: Not on file  Stress: Not on file  Social Connections: Not on file  Intimate Partner Violence: Not on file    Review of Systems:    Constitutional: No weight loss, fever or chills Cardiovascular: No chest pain Respiratory: No SOB  Gastrointestinal: See HPI and otherwise negative   Physical Exam:  Vital signs: BP 122/70   Pulse 77   Ht 5\' 8"  (1.727 m)   Wt 224 lb (101.6 kg)   BMI 34.06 kg/m    Constitutional:   Pleasant overweight Caucasian male appears to be in NAD, Well developed, Well nourished, alert and cooperative Head:  Normocephalic and atraumatic. Eyes:   PEERL, EOMI. No icterus. Conjunctiva pink. Ears:  Normal auditory acuity. Neck:  Supple Throat: Oral cavity and pharynx without inflammation, swelling or lesion.  Respiratory: Respirations even and unlabored. Lungs clear to  auscultation bilaterally.   No wheezes, crackles, or rhonchi.  Cardiovascular: Normal S1, S2. No MRG. Regular rate and rhythm. No peripheral edema, cyanosis or pallor.   Gastrointestinal:  Soft, nondistended, nontender. No rebound or guarding. Normal bowel sounds. No appreciable masses or hepatomegaly. Rectal:  External: area about 2 inchs X1.5 inches just posterior to the rectum of inflamed, moist, erythema with visible line of scarring down the middle, tender to palpation; internal: Some TTP posteriorly towards the area of external agitation; anoscopy: Grade 1 hemorrhoids and otherwise normal Msk:  Symmetrical without gross deformities. Without edema, no deformity or joint abnormality.  Neurologic:  Alert and  oriented x4;  grossly normal neurologically.  Skin:   Dry and intact without significant lesions or rashes. Psychiatric: Demonstrates good judgement and reason without abnormal affect or behaviors.  RELEVANT LABS AND IMAGING: CBC    Component Value Date/Time   WBC 6.1 02/25/2023 0855   RBC 4.76 02/25/2023 0855   HGB 14.2 02/25/2023 0855   HCT 43.7 02/25/2023 0855   PLT 183 02/25/2023 0855   MCV 91.8 02/25/2023 0855   MCH 29.8 02/25/2023 0855   MCHC 32.5 02/25/2023 0855   RDW 11.6 02/25/2023 0855   LYMPHSABS 2.8 07/13/2021 0736   MONOABS 0.5 07/13/2021 0736   EOSABS 0.1 07/13/2021 0736   BASOSABS 0.0 07/13/2021 0736    CMP     Component Value Date/Time   NA 136 02/25/2023 0855   K 3.6 02/25/2023 0855   CL 101 02/25/2023 0855   CO2 28 02/25/2023 0855   GLUCOSE 164 (H) 02/25/2023 0855   BUN 16 02/25/2023 0855   CREATININE 0.88 02/25/2023 0855   CALCIUM 9.0 02/25/2023 0855   PROT 7.6 10/22/2022 1113   ALBUMIN 4.3 10/22/2022 1113   AST 18 10/22/2022 1113   ALT 20 10/22/2022 1113   ALKPHOS 85 10/22/2022 1113   BILITOT 0.6 10/22/2022 1113   GFRNONAA >60 02/25/2023 0855    Assessment: 1.  Chronic constipation: Better with Linzess 145 mcg daily 2.  Rectal pain and irritation: Examined with Dr. Leonides Schanz as well at time of appointment, looks like an irritated dermatitis per her, very moist and erythematous, patient reports is itchy and  uncomfortable  Plan: 1.  Refill Linzess 145 mcg daily, 30 minutes before breakfast.  #90 with 3 refills 2.  Discussed using a barrier ointment over the area after it is dried to try and prevent moisture in this area and help it heal.  If he does this for a while and is not getting better then would recommend he go back to see the dermatologist for further evaluation. 3.  Patient to follow in clinic with [place] as needed.  Hyacinth Meeker, PA-C [facility] 05/07/2023, 8:19 AM

## 2023-05-07 NOTE — Patient Instructions (Signed)
 We have sent the following medications to your pharmacy for you to pick up at your convenience: Linzess 145 mg  Use Barrier ointment and see Dermatology.   _______________________________________________________  If your blood pressure at your visit was 140/90 or greater, please contact your primary care physician to follow up on this.  _______________________________________________________  If you are age 56 or older, your body mass index should be between 23-30. Your Body mass index is 34.06 kg/m. If this is out of the aforementioned range listed, please consider follow up with your Primary Care Provider.  If you are age 15 or younger, your body mass index should be between 19-25. Your Body mass index is 34.06 kg/m. If this is out of the aformentioned range listed, please consider follow up with your Primary Care Provider.   ________________________________________________________  The [place] [facility] providers would like to encourage you to use [facility] to communicate with providers for non-urgent requests or questions.  Due to long hold times on the telephone, sending your provider a message by [facility] may be a faster and more efficient way to get a response.  Please allow 48 business hours for a response.  Please remember that this is for non-urgent requests.  _______________________________________________________

## 2023-05-07 NOTE — Progress Notes (Signed)
 Attending Physician's Attestation   I have reviewed the chart.   I agree with the Advanced Practitioner's note, impression, and recommendations with any updates as below.    Corliss Parish, MD [facility] Advanced [facility]oscopy Office # [phone]

## 2023-05-12 DIAGNOSIS — K5909 Other constipation: Secondary | ICD-10-CM

## 2023-05-12 MED ORDER — LINACLOTIDE 145 MCG PO CAPS: 145.0000 ug | ORAL_CAPSULE | Freq: Every day | ORAL | 3 refills | Status: DC

## 2023-05-20 ENCOUNTER — Telehealth: Payer: Self-pay | Admitting: Physician Assistant

## 2023-05-20 NOTE — Telephone Encounter (Signed)
 Inbound call from the [facility] pharmacy requesting a call back to discuss prescription for Linzess. Please advise.   [phone]  Ext [phone]

## 2023-05-21 NOTE — Telephone Encounter (Signed)
 Spoke with Cameron [name] and informed her patient got Linzess from his local pharmacy.

## 2023-07-22 MED ORDER — LINACLOTIDE 72 MCG PO CAPS: 72.0000 ug | ORAL_CAPSULE | Freq: Every day | ORAL | 3 refills | Status: AC

## 2023-07-22 NOTE — Telephone Encounter (Signed)
 Bridgette Campus please review for Linzess 

## 2023-07-22 NOTE — Addendum Note (Signed)
 Addended by: Aneita Keens on: 07/22/2023 12:31 PM   Modules accepted: Orders

## 2023-10-19 ENCOUNTER — Other Ambulatory Visit: Payer: Self-pay | Admitting: Orthopedic Surgery

## 2023-10-23 IMAGING — DX DG KNEE COMPLETE 4+V*L*
4 series · 4 of 4 positions shown · non-contrast
Comparison: None Available.

CLINICAL DATA: 55-year-old male status post syncope. Struck head on
shower. Pain.

EXAM:
LEFT KNEE - COMPLETE 4+ VIEW

[knee lat]
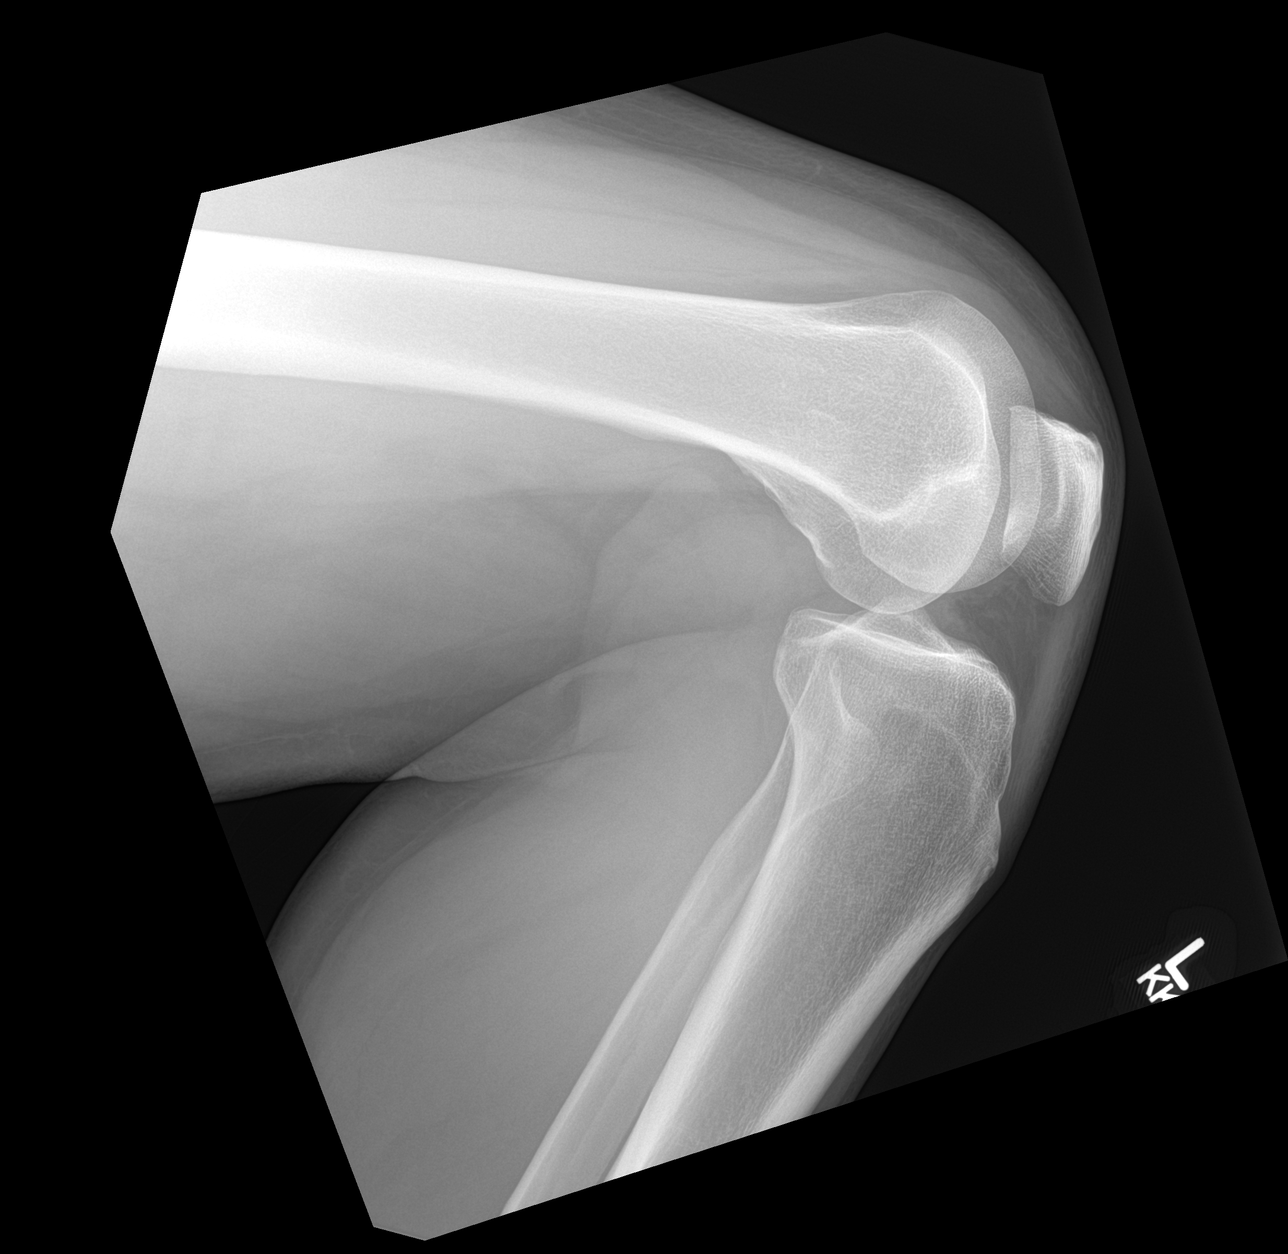

[foot ap]
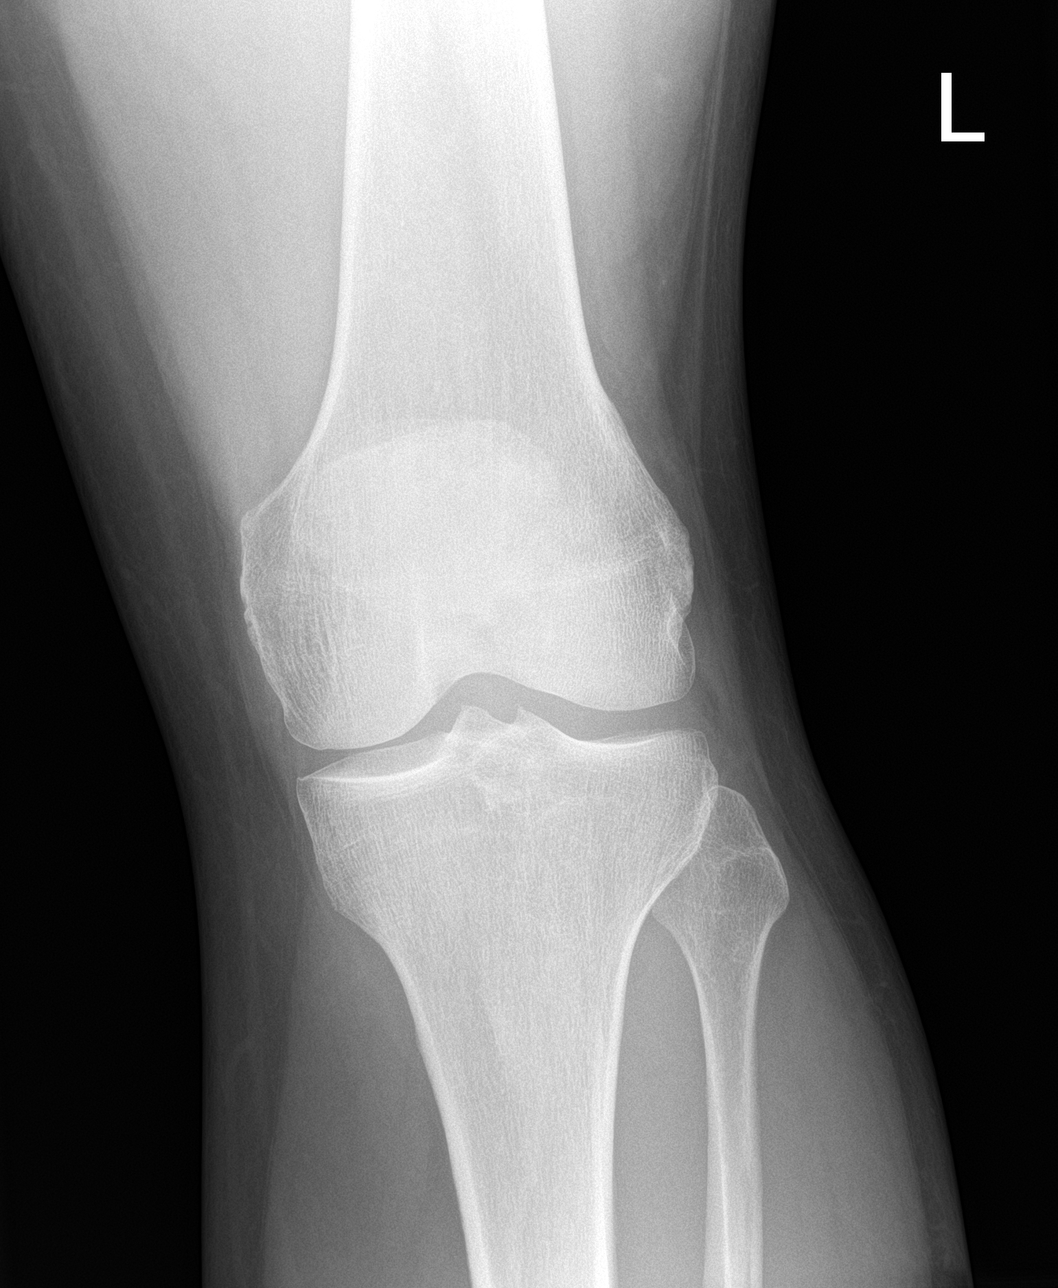

[knee obl (1 of 2)]
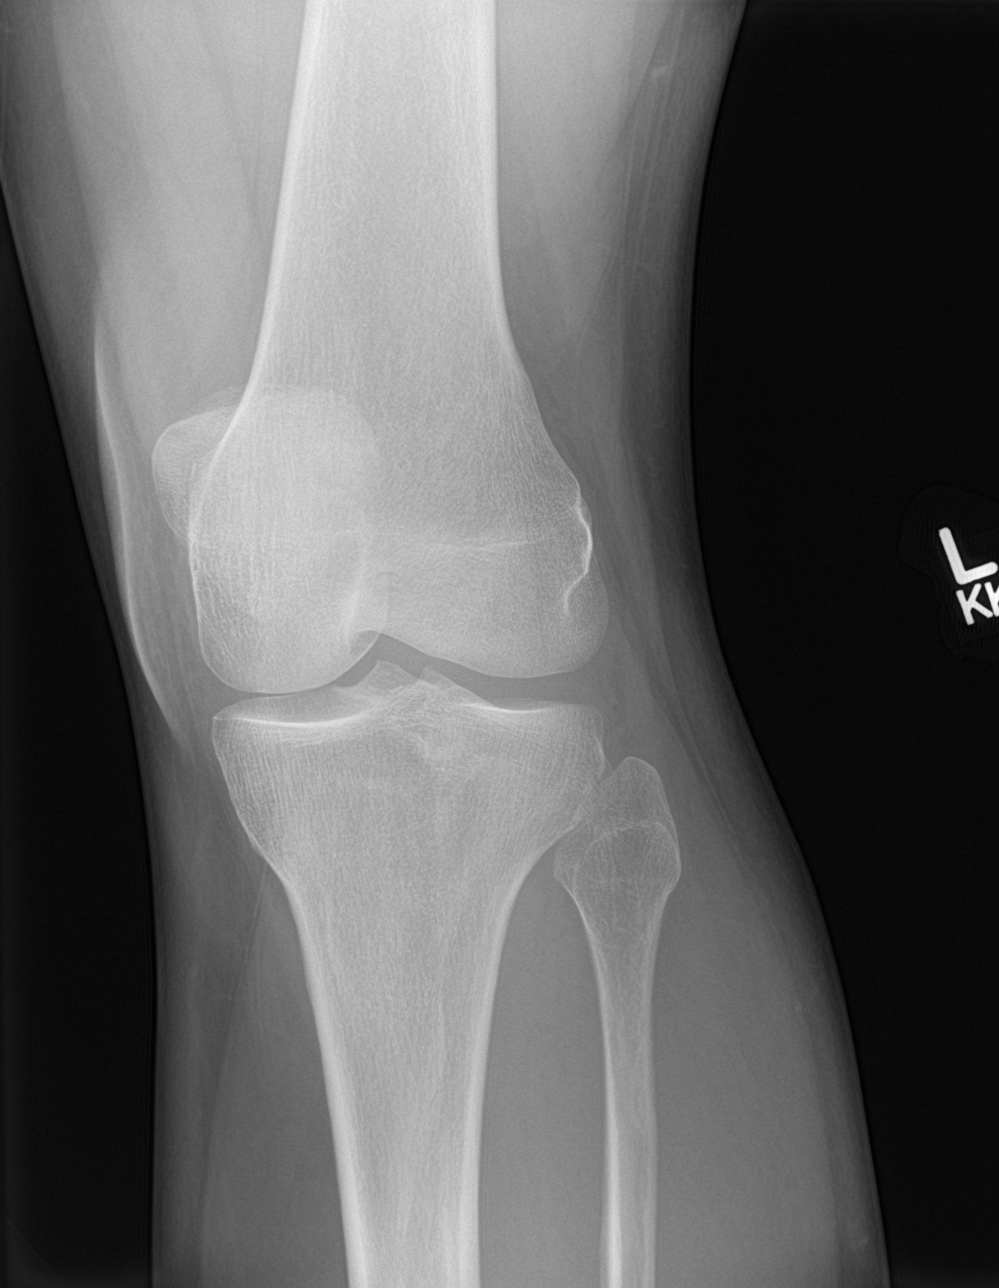

[knee obl (2 of 2)]
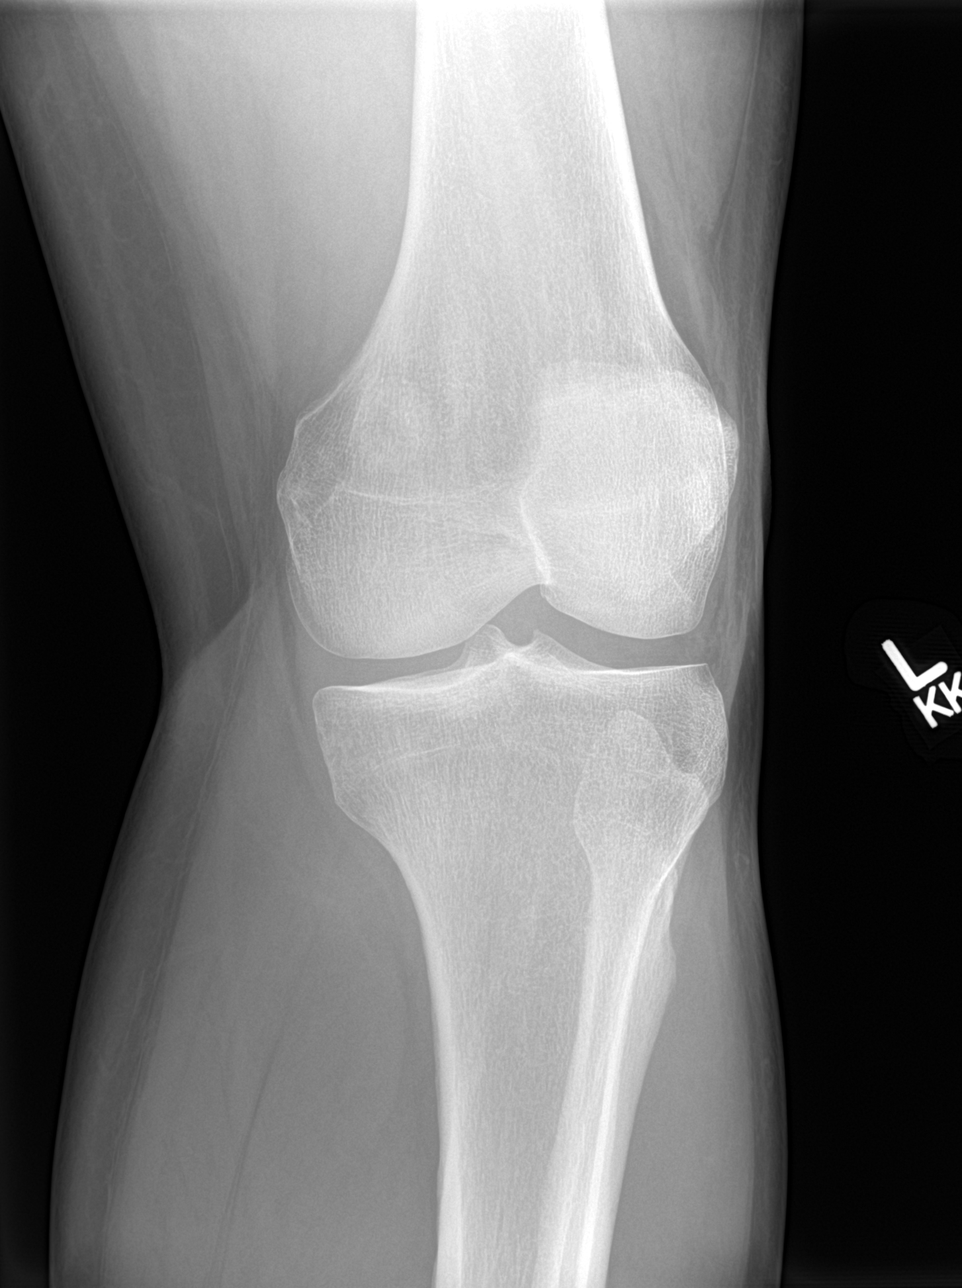

[4 of 4 positions shown; findings below may reference images not displayed]

FINDINGS: Bone mineralization is within normal limits. No evidence of
fracture, dislocation, or joint effusion. No evidence of arthropathy
or other focal bone abnormality. No discrete soft tissue injury.
IMPRESSION: Negative.

## 2024-01-18 ENCOUNTER — Other Ambulatory Visit: Payer: Self-pay | Admitting: Orthopedic Surgery

## 2024-02-15 ENCOUNTER — Other Ambulatory Visit: Payer: Self-pay | Admitting: Student

## 2024-02-15 DIAGNOSIS — M47812 Spondylosis without myelopathy or radiculopathy, cervical region: Secondary | ICD-10-CM

## 2024-02-17 ENCOUNTER — Ambulatory Visit
Admission: RE | Admit: 2024-02-17 | Discharge: 2024-02-17 | Disposition: A | Discharge status: Home / Self Care | Source: Ambulatory Visit | Attending: Student | Admitting: Student

## 2024-02-17 DIAGNOSIS — M47812 Spondylosis without myelopathy or radiculopathy, cervical region: Secondary | ICD-10-CM | POA: Insufficient documentation

## 2024-02-24 ENCOUNTER — Other Ambulatory Visit: Payer: Self-pay | Admitting: Neurosurgery

## 2024-03-01 ENCOUNTER — Ambulatory Visit

## 2024-03-11 NOTE — Pre-Procedure Instructions (Signed)
 Surgical Instructions   Your procedure is scheduled on March 24, 2024. Report to [facility] Main Entrance A at 08:50 A.M., then check in with the Admitting office. Any questions or running late day of surgery: call [phone]  Questions prior to your surgery date: call [phone], Monday-Friday, 8am-4pm. If you experience any cold or flu symptoms such as cough, fever, chills, shortness of breath, etc. between now and your scheduled surgery, please notify us  at the above number.     Remember:  Do not eat or drink after midnight the night before your surgery    Take these medicines the morning of surgery with A SIP OF WATER  CYMBALTA  fluticasone  (FLONASE )  levocetirizine (XYZAL )  linaclotide  (LINZESS )  TRELEGY ELLIPTA   May take these medicines IF NEEDED: acetaminophen  (TYLENOL )  albuterol  (VENTOLIN  HFA) - please bring with you to the hospital azelastine  (ASTELIN ) 0.1 % nasal spray  cyclobenzaprine  (FLEXERIL )  HYDROcodone-acetaminophen  (NORCO/VICODIN)  hydrOXYzine  (ATARAX )  Olopatadine HCl (PATADAY)  ondansetron  (ZOFRAN -ODT)  rizatriptan (MAXALT)   One week prior to surgery, STOP taking any Aspirin (unless otherwise instructed by your surgeon) Aleve, Naproxen, Ibuprofen, Motrin, Advil, Goody's, BC's, all herbal medications, fish oil, and non-prescription vitamins. This includes your diclofenac Sodium (VOLTAREN) 1 % GEL.  Stop taking MOUNJARO one week prior to surgery. Your last dose should be prior to 03/16/24.                      Do NOT Smoke (Tobacco/Vaping) for 24 hours prior to your procedure.  If you use a CPAP at night, you may bring your mask/headgear for your overnight stay.   You will be asked to remove any contacts, glasses, piercing's, hearing aid's, dentures/partials prior to surgery. Please bring cases for these items if needed.    Your surgeon will determine if you are to be admitted or discharged the same day.  Patients discharged the day of surgery  will not be allowed to drive home, and someone needs to stay with them for 24 hours.  SURGICAL WAITING ROOM VISITATION Patients may have no more than 2 support people in the waiting area - these visitors may rotate.   Pre-op nurse will coordinate an appropriate time for 2 ADULT support persons, who may not rotate, to accompany patient in pre-op.  Children under the age of 82 must have an adult with them who is not the patient and must remain in the main waiting area with an adult.  If the patient needs to stay at the hospital during part of their recovery, the visitor guidelines for inpatient rooms apply.  Please refer to the [facility] website for the visitor guidelines for any additional information.   If you received a COVID test during your pre-op visit  it is requested that you wear a mask when out in public, stay away from anyone that may not be feeling well and notify your surgeon if you develop symptoms. If you have been in contact with anyone that has tested positive in the last 10 days please notify you surgeon.      Pre-operative 4 CHG Bathing Instructions   You can play a key role in reducing the risk of infection after surgery. Your skin needs to be as free of germs as possible. You can reduce the number of germs on your skin by washing with CHG (chlorhexidine  gluconate) soap before surgery. CHG is an antiseptic soap that kills germs and continues to kill germs even after washing.  DO NOT use if you have an allergy to chlorhexidine /CHG or antibacterial soaps. If your skin becomes reddened or irritated, stop using the CHG and notify one of our RNs at [phone].   Please shower with the CHG soap starting 4 days before surgery using the following schedule:     Please keep in mind the following:  DO NOT shave, including legs and underarms, starting the day of your first shower.   You may shave your face at any point before/day of surgery.  Place clean sheets on your bed  the day you start using CHG soap. Use a clean washcloth (not used since being washed) for each shower. DO NOT sleep with pets once you start using the CHG.   CHG Shower Instructions:  Wash your face and private area with normal soap. If you choose to wash your hair, wash first with your normal shampoo.  After you use shampoo/soap, rinse your hair and body thoroughly to remove shampoo/soap residue.  Turn the water OFF and apply  bottle of CHG soap to a CLEAN washcloth.  Apply CHG soap ONLY FROM YOUR NECK DOWN TO YOUR TOES (washing for 3-5 minutes)  DO NOT use CHG soap on face, private areas, open wounds, or sores.  Pay special attention to the area where your surgery is being performed.  If you are having back surgery, having someone wash your back for you may be helpful. Wait 2 minutes after CHG soap is applied, then you may rinse off the CHG soap.  Pat dry with a clean towel  Put on clean clothes/pajamas   If you choose to wear lotion, please use ONLY the CHG-compatible lotions that are listed below.  Additional instructions for the day of surgery:  If you choose, you may shower the morning of surgery with an antibacterial soap.  DO NOT APPLY any lotions, deodorants, cologne, or perfumes.   Do not bring valuables to the hospital. [facility] is not responsible for any belongings/valuables. Do not wear nail polish, gel polish, artificial nails, or any other type of covering on natural nails (fingers and toes) Do not wear jewelry or makeup Put on clean/comfortable clothes.  Please brush your teeth.  Ask your nurse before applying any prescription medications to the skin.     CHG Compatible Lotions   Aveeno Moisturizing lotion  Cetaphil Moisturizing Cream  Cetaphil Moisturizing Lotion  Clairol Herbal Essence Moisturizing Lotion, Dry Skin  Clairol Herbal Essence Moisturizing Lotion, Extra Dry Skin  Clairol Herbal Essence Moisturizing Lotion, Normal Skin  Curel Age Defying  Therapeutic Moisturizing Lotion with Alpha Hydroxy  Curel Extreme Care Body Lotion  Curel Soothing Hands Moisturizing Hand Lotion  Curel Therapeutic Moisturizing Cream, Fragrance-Free  Curel Therapeutic Moisturizing Lotion, Fragrance-Free  Curel Therapeutic Moisturizing Lotion, Original Formula  Eucerin Daily Replenishing Lotion  Eucerin Dry Skin Therapy Plus Alpha Hydroxy Crme  Eucerin Dry Skin Therapy Plus Alpha Hydroxy Lotion  Eucerin Original Crme  Eucerin Original Lotion  Eucerin Plus Crme Eucerin Plus Lotion  Eucerin TriLipid Replenishing Lotion  Keri Anti-Bacterial Hand Lotion  Keri Deep Conditioning Original Lotion Dry Skin Formula Softly Scented  Keri Deep Conditioning Original Lotion, Fragrance Free Sensitive Skin Formula  Keri Lotion Fast Absorbing Fragrance Free Sensitive Skin Formula  Keri Lotion Fast Absorbing Softly Scented Dry Skin Formula  Keri Original Lotion  Keri Skin Renewal Lotion Keri Silky Smooth Lotion  Keri Silky Smooth Sensitive Skin Lotion  Nivea Body Creamy Conditioning Oil  Nivea Body Extra Enriched Lotion  [place]  Body Original Lotion  Nivea Body Sheer Moisturizing Lotion Nivea Crme  Nivea Skin Firming Lotion  NutraDerm 30 Skin Lotion  NutraDerm Skin Lotion  NutraDerm Therapeutic Skin Cream  NutraDerm Therapeutic Skin Lotion  ProShield Protective Hand Cream  Provon moisturizing lotion  Please read over the following fact sheets that you were given.

## 2024-03-14 ENCOUNTER — Inpatient Hospital Stay (HOSPITAL_COMMUNITY)
Admission: RE | Admit: 2024-03-14 | Discharge: 2024-03-14 | Disposition: A | Source: Ambulatory Visit | Attending: Neurosurgery

## 2024-03-14 ENCOUNTER — Encounter (HOSPITAL_COMMUNITY): Payer: Self-pay

## 2024-03-14 ENCOUNTER — Other Ambulatory Visit: Payer: Self-pay

## 2024-03-14 VITALS — BP 113/78 | HR 77 | Temp 98.3°F | Resp 18 | Ht 68.0 in | Wt 186.0 lb

## 2024-03-14 DIAGNOSIS — Z7985 Long-term (current) use of injectable non-insulin antidiabetic drugs: Secondary | ICD-10-CM | POA: Insufficient documentation

## 2024-03-14 DIAGNOSIS — I10 Essential (primary) hypertension: Secondary | ICD-10-CM | POA: Insufficient documentation

## 2024-03-14 DIAGNOSIS — E119 Type 2 diabetes mellitus without complications: Secondary | ICD-10-CM | POA: Insufficient documentation

## 2024-03-14 DIAGNOSIS — Z85828 Personal history of other malignant neoplasm of skin: Secondary | ICD-10-CM | POA: Insufficient documentation

## 2024-03-14 DIAGNOSIS — Z7984 Long term (current) use of oral hypoglycemic drugs: Secondary | ICD-10-CM | POA: Insufficient documentation

## 2024-03-14 DIAGNOSIS — M4722 Other spondylosis with radiculopathy, cervical region: Secondary | ICD-10-CM | POA: Insufficient documentation

## 2024-03-14 DIAGNOSIS — G4733 Obstructive sleep apnea (adult) (pediatric): Secondary | ICD-10-CM | POA: Insufficient documentation

## 2024-03-14 DIAGNOSIS — Z01818 Encounter for other preprocedural examination: Secondary | ICD-10-CM | POA: Insufficient documentation

## 2024-03-14 HISTORY — DX: Tinnitus, unspecified ear: H93.19

## 2024-03-14 HISTORY — DX: Post-traumatic stress disorder, unspecified: F43.10

## 2024-03-14 HISTORY — DX: Presence of external hearing-aid: Z97.4

## 2024-03-14 LAB — CBC
HCT: 41 % (ref 39.0–52.0)
Hemoglobin: 13.8 g/dL (ref 13.0–17.0)
MCH: 30.5 pg (ref 26.0–34.0)
MCHC: 33.7 g/dL (ref 30.0–36.0)
MCV: 90.7 fL (ref 80.0–100.0)
Platelets: 182 10*3/uL (ref 150–400)
RBC: 4.52 MIL/uL (ref 4.22–5.81)
RDW: 12 % (ref 11.5–15.5)
WBC: 6.6 10*3/uL (ref 4.0–10.5)
nRBC: 0 % (ref 0.0–0.2)

## 2024-03-14 LAB — SURGICAL PCR SCREEN
MRSA, PCR: NEGATIVE
Staphylococcus aureus: NEGATIVE

## 2024-03-14 LAB — GLUCOSE, CAPILLARY: Glucose-Capillary: 144 mg/dL — ABNORMAL HIGH (ref 70–99)

## 2024-03-14 LAB — TYPE AND SCREEN
ABO/RH(D): A POS
Antibody Screen: NEGATIVE

## 2024-03-14 NOTE — Progress Notes (Signed)
 PCP - Waylan Maxwell, MD  Cardiologist - at [facility]  PPM/ICD - denies  Chest x-ray -  NA EKG - 03/14/2024  Stress Test - 2024- at [facility] in [facility] ECHO - 2024- at [facility] in [facility] Cardiac Cath - denies  Sleep Study - uses CPAP- pressure settings 11   Fasting Blood Sugar - does not regularly check CBG. Hgb A1c was 5.6 on 02/24/24. Pt reports he ate before coming to PAT appointment- 144 today.   Last dose of GLP1 agonist-  LD 03/14/24 - Mounjaro- this will be his last dose before surgery  Blood Thinner Instructions: denies Aspirin Instructions:denies  ERAS Protcol - NPO order   COVID TEST- NA   Anesthesia review: difficult airway  Patient denies shortness of breath, fever, cough and chest pain at PAT appointment   All instructions explained to the patient, with a verbal understanding of the material. Patient agrees to go over the instructions while at home for a better understanding. The opportunity to ask questions was provided.

## 2024-03-15 NOTE — Progress Notes (Signed)
 Anesthesia Chart Review:  Case: [id number] Date/Time: 03/24/24 1038   Procedure: ANTERIOR CERVICAL DECOMPRESSION/DISCECTOMY FUSION 2 LEVELS - ACDF, IP ,PLATE/SCREWS R65, C45   Anesthesia type: General   Diagnosis: Cervical spondylosis with radiculopathy [M47.22]   Pre-op diagnosis: Cervical spondylosis with radiculopathy   [facility] ROOM 19 / [facility] OR   Surgeons: Mavis Purchase, MD       DISCUSSION: Patient is a 59 year old [name] scheduled for the above procedure.   History includes never smoker, DIFFICULT AIRWAY (narrow due to cervical fusion, required Glidescope previously), HTN, DM2, asthma, OSA (uses CPAP), nephrolithiasis, skin cancer, PTSD, anxiety, tinnitus, hearing, aids, spinal surgery (ACDF C5-7 12/19/2011; removal of C5-7 plate and redo ACDF C6-7 01/27/2022; C5-7 posterior fusion 03/04/2023), osteoarthritis (left THA 2020).    Previously intubations using a Glidescope due to cervical fusion.  - 03/04/2023: IV induction, mask ventilation without difficulty, Glidescope and 4, Grade II view, 7.5 mm ETT, 1 attempt, Stylet, oral airway and Bite block.  - 01/27/2022: IV induction, mask ventilation without difficulty, Glidescope and 4, Grade I view, 7.5 mm ETT, 1 attempt, Stylet and Video-laryngoscopy    Asthma and allergic rhinitis are managed by allergist Dr. Sondra with [facility]. Notes suggest also sees a pulmonologist with the [facility]. Last visit with Dr. Sondra was on 01/25/2024. He had 2 asthma exacerbation in the past 6 months, last in November in the setting of viral illness. Trelegy prescribed to replace Dulere and Spiriva . Tezpsire initiated through [place] authorization. Current medications include albuterol  HFA or nebulizer as needed, Astelin  nasal spray as needed, Flonase  nasal spray daily, Xyzal  BID,  Singulair  Q HS, Pataday as needed, Tezspire injections Q 28 days, Trelegy daily.    A1c 5.6% on 02/24/2024. On Moujaro, last dose 03/14/2024 and to until until after surgery.   Not currently on metformin  (because of low B12). Had routine PCP follow-up with Dr. Waylan on 03/11/2024. She is aware of surgery plans.    Anesthesia team to evaluate on the day of surgery.    VS: BP 113/78   Pulse 77   Temp 36.8 C   Resp 18   Ht 5' 8 (1.727 m)   Wt 84.4 kg   SpO2 99%   BMI 28.28 kg/m    PROVIDERS: Waylan Maxwell, MD is PCP - Radojicic, Cristine, MD is allergist ([facility]) - He is not routinely followed by cardiology but had a [facility] evaluation in 10/2021 after a syncopal episode upon standing in 07/2021. BP was low and losartan discontinued. He had a normal echo and 14 day monitor showed SR, 4 SVT events up to 9 beats, triggered events corresponded with PAC, PVC, or ST, and 13 were unassociated. Last virtual visit on 03/20/2022 with Juliene Casey, PA also mentioned known vertigo history and chronic chest pain unchanged for over Cameron years with prior negative stress tests, last in 2014. He recommended hydration, stand slowly, continue to hold losartan. As needed cardiology follow-up.   LABS: Labs in [facility] from 02/25/2023 and 03/14/2024 reviewed. Results included: Lab Results  Component Value Date   WBC 6.6 03/14/2024   HGB 13.8 03/14/2024   HCT 41.0 03/14/2024   PLT 182 03/14/2024   GLUCOSE 164 (H) 02/25/2023   NA 136 02/25/2023   K 3.6 02/25/2023   CL 101 02/25/2023   CREATININE 0.88 02/25/2023   BUN 16 02/25/2023   CO2 28 02/25/2023   TSH 1.22 10/22/2022   He  also had labs through Dr. Waylan (Care Everywhere) on 02/24/2024:  Results include A1c 5.6%, normal LFTs.    IMAGES: MRI C-spine 02/17/2024: IMPRESSION: - Interval decrease of volume of fluid collection posterior to the operative levels in the cervical spine. - Adjacent segment degeneration C4/C5 with disc protrusion indenting the left ventral surface of the thecal sac and deforming the left ventral surface of the cord and associated bilateral left greater than right foraminal narrowing as previously  seen, similar to prior. - Additional levels of cervical spondylosis as above.   CXR 01/19/2024 ([facility]): FINDINGS/IMPRESSION: 1.  Cardiac and mediastinal contours normal. 2.  Lungs are clear. 3.  No pleural effusion. No pneumothorax. 4.  Upper abdomen unremarkable. Thoracic spine degenerative changes. Cervical spine hardware partially visualized.    EKG: 03/14/2024: NSR    CV: 2023 [facility] Cardiac Testing as outlined by Juliene Casey, PA at 03/20/22 virtual follow-up visit:   Echo 2023 [facility]; has encounter encounter date of 12/19/21):   Interpretation Summary Normal transthoracic echocardiogram. There are no prior studies for  comparison. Left ventricular systolic function is normal. Ejection Fraction = >55%. Normal diastolic function Normal transthoracic echocardiogram. There are no prior studies for comparison.   14 Day Monitor 11/2021: NSR, 4 SVT events, fastest  3 beat 156, longest 9 beat 110, 22 triggered events, one  correspond with pac and one with pvc, 7 with sinus tach and 13  unassociated      Nuclear stress test 10/07/12 ([facility]): NTERPRETATION:    PERFUSION: There is normal distribution of activity throughout the left ventricular myocardium both at rest and following stress.   WALL MOTION AND THICKENING : Normal wall motion and thickening is seen throughout the left ventricular myocardium.   RESTING LEFT VENTRICULAR EJECTION FRACTION:   65%   Past Medical History:  Diagnosis Date   Anxiety    Arthritis    generalized   Asthma    Cancer (HCC)    skin   Depression    Diabetes mellitus without complication (HCC)    type 2   Difficult intubation    has narrow airway d/t fusion and has needed camera in past procedures   History of kidney stones    Hypertension    has been off of medicine for a few years as of 03/14/24   Pneumonia    PTSD (post-traumatic stress disorder)    Sleep apnea    Tinnitus    Uses hearing aid    bilateral    Past Surgical  History:  Procedure Laterality Date   ANTERIOR CERVICAL DECOMP/DISCECTOMY FUSION N/A 01/27/2022   Procedure: ACDF,IP,PLATE/SCREWS R32;ZKEONMZ FUSION;REM CERV HARDWARE;  Surgeon: Mavis Purchase, MD;  Location: [facility] OR;  Service: Neurosurgery;  Laterality: N/A;  3C   BACK SURGERY     neck 2011 and 2013   BIOPSY  12/16/2022   Procedure: BIOPSY;  Surgeon: Wilhelmenia Aloha Raddle., MD;  Location: [facility];  Service: Gastroenterology;;   ORIN MEDIATE RELEASE Right    COLONOSCOPY WITH PROPOFOL  N/A 12/16/2022   Procedure: COLONOSCOPY WITH PROPOFOL ;  Surgeon: Wilhelmenia Aloha Raddle., MD;  Location: [facility];  Service: Gastroenterology;  Laterality: N/A;   ESOPHAGOGASTRODUODENOSCOPY (EGD) WITH PROPOFOL  N/A 12/16/2022   Procedure: ESOPHAGOGASTRODUODENOSCOPY (EGD) WITH PROPOFOL ;  Surgeon: Wilhelmenia Aloha Raddle., MD;  Location: [facility];  Service: Gastroenterology;  Laterality: N/A;   HERNIA REPAIR     left inguinal hernia repair as a child   KNEE ARTHROSCOPY Left    NASAL SEPTUM SURGERY  1986   POSTERIOR CERVICAL FUSION/FORAMINOTOMY Left 03/04/2023   Procedure: CERVICAL  SIX-SEVEN FORAMINOTOMY, CERVICAL SIX- SEVEN POSTERIOR INSTRUMENTATION AND FUSION;  Surgeon: Mavis Purchase, MD;  Location: [facility] OR;  Service: Neurosurgery;  Laterality: Left;  3C   REPAIR / REINSERT BICEPS TENDON AT ELBOW Right 2023   ROTATOR CUFF REPAIR Left    and bicep repair   SAVORY DILATION N/A 12/16/2022   Procedure: SAVORY DILATION;  Surgeon: Wilhelmenia Aloha Raddle., MD;  Location: [facility];  Service: Gastroenterology;  Laterality: N/A;   TENDON REPAIR Right    elbow   TOTAL HIP ARTHROPLASTY Left     MEDICATIONS:  acetaminophen  (TYLENOL ) 325 MG tablet   albuterol  (VENTOLIN  HFA) 108 (90 Base) MCG/ACT inhaler   ALBUTEROL  SULFATE IN   atorvastatin  (LIPITOR) Cameron MG tablet   azelastine  (ASTELIN ) 0.1 % nasal spray   cyclobenzaprine  (FLEXERIL ) 10 MG tablet   CYMBALTA 60 MG capsule   diclofenac Sodium  (VOLTAREN) 1 % GEL   fluticasone  (FLONASE ) 50 MCG/ACT nasal spray   HYDROcodone-acetaminophen  (NORCO/VICODIN) 5-325 MG tablet   hydrOXYzine  (ATARAX ) 25 MG tablet   ibuprofen (ADVIL) 800 MG tablet   levocetirizine (XYZAL ) 5 MG tablet   linaclotide  (LINZESS ) 72 MCG capsule   metFORMIN  (GLUCOPHAGE ) 1000 MG tablet   montelukast  (SINGULAIR ) 10 MG tablet   MOUNJARO 7.5 MG/0.5ML Pen   Olopatadine HCl (PATADAY) 0.2 % SOLN   ondansetron  (ZOFRAN -ODT) 4 MG disintegrating tablet   rizatriptan (MAXALT) 10 MG tablet   tamsulosin (FLOMAX) 0.4 MG CAPS capsule   Tezepelumab-ekko (TEZSPIRE) 210 MG/1. SOAJ   traZODone  (DESYREL ) 50 MG tablet   TRELEGY ELLIPTA 200-62.5-25 MCG/ACT AEPB   triamcinolone ointment (KENALOG) 0.1 %   Wheat Dextrin (BENEFIBER DRINK MIX PO)   No current facility-administered medications for this encounter.    Isaiah Ruder, PA-C Surgical Short Stay/Anesthesiology [facility] Phone [phone] [facility] Phone [phone] 03/15/2024 2:42 PM

## 2024-03-24 ENCOUNTER — Ambulatory Visit (HOSPITAL_COMMUNITY): Admit: 2024-03-24 | Admitting: Neurosurgery

## 2024-03-24 ENCOUNTER — Encounter (HOSPITAL_COMMUNITY): Payer: Self-pay | Admitting: Vascular Surgery

## 2024-06-27 ENCOUNTER — Ambulatory Visit (HOSPITAL_BASED_OUTPATIENT_CLINIC_OR_DEPARTMENT_OTHER): Admit: 2024-06-27 | Admitting: Orthopedic Surgery

## 2024-06-27 ENCOUNTER — Encounter (HOSPITAL_BASED_OUTPATIENT_CLINIC_OR_DEPARTMENT_OTHER): Payer: Self-pay

## 2024-06-27 SURGERY — REMOVAL, CYST, HAND
Anesthesia: Choice | Site: Ring Finger | Laterality: Left
